# Patient Record
Sex: Female | Born: 1990 | Marital: Married | Smoking: Never smoker
Health system: Southern US, Community
[De-identification: ages and names within clinical notes are randomized; demographics above are authoritative.]

## PROBLEM LIST (undated history)

## (undated) DIAGNOSIS — Z789 Other specified health status: Secondary | ICD-10-CM

## (undated) DIAGNOSIS — E538 Deficiency of other specified B group vitamins: Secondary | ICD-10-CM

## (undated) DIAGNOSIS — E559 Vitamin D deficiency, unspecified: Secondary | ICD-10-CM

## (undated) HISTORY — DX: Vitamin D deficiency, unspecified: E55.9

## (undated) HISTORY — DX: Deficiency of other specified B group vitamins: E53.8

---

## 2016-02-16 HISTORY — PX: CERVICAL CERCLAGE: SHX1329

## 2016-02-16 NOTE — L&D Delivery Note (Signed)
Delivery Note Pt became complete at 5:33p and pushed well. At 6:48 PM a viable female was delivered via Vaginal, Spontaneous Delivery (Presentation: OA ).  APGAR: 9, 9; weight 5 lb 9.1 oz (2526 g).   Placenta status: spont, intact .  Cord: 3 vessel Cord clamped and cut by FOB; hospital cord blood sample collected.  Anesthesia:  None Episiotomy: None Lacerations: 2nd degree;Perineal Suture Repair: 3.0 monocryl Est. Blood Loss (mL): 200  Mom to postpartum.  Baby to Couplet care / Skin to Skin.  Cam HaiSHAW, Beulah Capobianco CNM 06/25/2016, 9:05 PM

## 2016-02-20 LAB — OB RESULTS CONSOLE HIV ANTIBODY (ROUTINE TESTING): HIV: NONREACTIVE

## 2016-02-20 LAB — OB RESULTS CONSOLE HGB/HCT, BLOOD: Hemoglobin: 10.2 g/dL

## 2016-04-08 ENCOUNTER — Encounter: Payer: Self-pay | Admitting: *Deleted

## 2016-04-13 ENCOUNTER — Ambulatory Visit (INDEPENDENT_AMBULATORY_CARE_PROVIDER_SITE_OTHER): Payer: Medicaid Other | Admitting: Student

## 2016-04-13 ENCOUNTER — Encounter: Payer: Self-pay | Admitting: *Deleted

## 2016-04-13 ENCOUNTER — Encounter: Payer: Self-pay | Admitting: Student

## 2016-04-13 VITALS — BP 116/75 | HR 103 | Wt 130.8 lb

## 2016-04-13 DIAGNOSIS — O26879 Cervical shortening, unspecified trimester: Secondary | ICD-10-CM

## 2016-04-13 DIAGNOSIS — Z113 Encounter for screening for infections with a predominantly sexual mode of transmission: Secondary | ICD-10-CM | POA: Diagnosis not present

## 2016-04-13 DIAGNOSIS — Z789 Other specified health status: Secondary | ICD-10-CM | POA: Insufficient documentation

## 2016-04-13 DIAGNOSIS — O0990 Supervision of high risk pregnancy, unspecified, unspecified trimester: Secondary | ICD-10-CM

## 2016-04-13 DIAGNOSIS — O26873 Cervical shortening, third trimester: Secondary | ICD-10-CM | POA: Diagnosis not present

## 2016-04-13 LAB — POCT URINALYSIS DIP (DEVICE)
Bilirubin Urine: NEGATIVE
Glucose, UA: NEGATIVE mg/dL
Hgb urine dipstick: NEGATIVE
Ketones, ur: NEGATIVE mg/dL
Nitrite: NEGATIVE
Protein, ur: NEGATIVE mg/dL
Specific Gravity, Urine: 1.01 (ref 1.005–1.030)
Urobilinogen, UA: 0.2 mg/dL (ref 0.0–1.0)
pH: 7 (ref 5.0–8.0)

## 2016-04-13 NOTE — Progress Notes (Signed)
Used [employer] interpreter [phone].  States had brownish discharge several times before she got a  cerclage while in [place]- none since 1/216 /17.

## 2016-04-13 NOTE — Progress Notes (Signed)
  Subjective:    Danielle Monroe is a G1P0 3560w2d being seen today for her first obstetrical visit.  Her obstetrical history is significant for ?cervical shortening with cerclage placement. . Patient does intend to breast feed. Pregnancy history fully reviewed. Patient states she had cerclage placed 1/16 due to her impending travel to the [place] from [place]. States she did not have preterm labor or cervical shortening. States she received weekly injections for 1 month but does not know what the injections were. States they were given to her b/c of brown discharge she was having.   Patient reports no complaints.  Vitals:   04/13/16 1302  BP: 116/75  Pulse: (!) 103  Weight: 130 lb 12.8 oz (59.3 kg)    HISTORY: OB History  Gravida Para Term Preterm AB Living  1            SAB TAB Ectopic Multiple Live Births               # Outcome Date GA Lbr Len/2nd Weight Sex Delivery Anes PTL Lv  1 Current              History reviewed. No pertinent past medical history. Past Surgical History:  Procedure Laterality Date  . CERVICAL CERCLAGE  02/2016   No family history on file.   Exam    Uterus:  Fundal Height: 28 cm  Pelvic Exam: Deferred d/t cerclage   Skin: normal coloration and turgor, no rashes    Neurologic: oriented, normal mood   Extremities: normal strength, tone, and muscle mass   HEENT PERRLA, thyroid without masses and trachea midline   Mouth/Teeth mucous membranes moist, pharynx normal without lesions and dental hygiene good   Neck supple and no masses   Cardiovascular: regular rate and rhythm, no murmurs or gallops   Respiratory:  appears well, vitals normal, no respiratory distress, acyanotic, normal RR, chest clear, no wheezing, crepitations, rhonchi, normal symmetric air entry   Abdomen: soft, non-tender; bowel sounds normal; no masses,  no organomegaly      Assessment:    Pregnancy: G1P0 Patient Active Problem List   Diagnosis Date Noted  . Short cervix  affecting pregnancy 04/13/2016  . Supervision of high-risk pregnancy 04/13/2016  . Language barrier 04/13/2016        Plan:  1. Short cervix affecting pregnancy -Prenatal record from [place] scanned to media. Per ultraound on 1/16, cervical length 3.8 cm & cervix closed. Per MD note, pt had cerclage placed d/t cervical shortening & funneling. Pt states that she did not have cervical shortening & only had cerclage placed to decrease chances of PTL during travel to the [place]. Type of cerclage not indicated in prenatal record. Will have pt see MD next visit to evaluate.  - Culture, OB Urine - Prenatal Profile I - GC/Chlamydia probe amp ([place])not at [facility]  2. Supervision of high risk pregnancy, antepartum -Pt is not fasting today. Will have her return later this week for 2hr gtt - Culture, OB Urine - Prenatal Profile I - GC/Chlamydia probe amp ([place])not at [facility]  3. Language barrier -Gujarati -- pt signed paperwork for aunt to interpret     Initial labs drawn. Prenatal vitamins. Problem list reviewed and updated.  Danielle Monroe 04/13/2016

## 2016-04-13 NOTE — Patient Instructions (Signed)
. °Preterm Labor and Birth Information °The normal length of a pregnancy is 39-41 weeks. Preterm labor is when labor starts before 37 completed weeks of pregnancy. °What are the risk factors for preterm labor? °Preterm labor is more likely to occur in women who: °· Have certain infections during pregnancy such as a bladder infection, sexually transmitted infection, or infection inside the uterus (chorioamnionitis). °· Have a shorter-than-normal cervix. °· Have gone into preterm labor before. °· Have had surgery on their cervix. °· Are younger than age 17 or older than age 35. °· Are African American. °· Are pregnant with twins or multiple babies (multiple gestation). °· Take street drugs or smoke while pregnant. °· Do not gain enough weight while pregnant. °· Became pregnant shortly after having been pregnant. °What are the symptoms of preterm labor? °Symptoms of preterm labor include: °· Cramps similar to those that can happen during a menstrual period. The cramps may happen with diarrhea. °· Pain in the abdomen or lower back. °· Regular uterine contractions that may feel like tightening of the abdomen. °· A feeling of increased pressure in the pelvis. °· Increased watery or bloody mucus discharge from the vagina. °· Water breaking (ruptured amniotic sac). °Why is it important to recognize signs of preterm labor? °It is important to recognize signs of preterm labor because babies who are born prematurely may not be fully developed. This can put them at an increased risk for: °· Long-term (chronic) heart and lung problems. °· Difficulty immediately after birth with regulating body systems, including blood sugar, body temperature, heart rate, and breathing rate. °· Bleeding in the brain. °· Cerebral palsy. °· Learning difficulties. °· Death. °These risks are highest for babies who are born before 34 weeks of pregnancy. °How is preterm labor treated? °Treatment depends on the length of your pregnancy, your condition,  and the health of your baby. It may involve: °· Having a stitch (suture) placed in your cervix to prevent your cervix from opening too early (cerclage). °· Taking or being given medicines, such as: °¨ Hormone medicines. These may be given early in pregnancy to help support the pregnancy. °¨ Medicine to stop contractions. °¨ Medicines to help mature the baby’s lungs. These may be prescribed if the risk of delivery is high. °¨ Medicines to prevent your baby from developing cerebral palsy. °If the labor happens before 34 weeks of pregnancy, you may need to stay in the hospital. °What should I do if I think I am in preterm labor? °If you think that you are going into preterm labor, call your health care provider right away. °How can I prevent preterm labor in future pregnancies? °To increase your chance of having a full-term pregnancy: °· Do not use any tobacco products, such as cigarettes, chewing tobacco, and e-cigarettes. If you need help quitting, ask your health care provider. °· Do not use street drugs or medicines that have not been prescribed to you during your pregnancy. °· Talk with your health care provider before taking any herbal supplements, even if you have been taking them regularly. °· Make sure you gain a healthy amount of weight during your pregnancy. °· Watch for infection. If you think that you might have an infection, get it checked right away. °· Make sure to tell your health care provider if you have gone into preterm labor before. °This information is not intended to replace advice given to you by your health care provider. Make sure you discuss any questions you have with your   health care provider. °Document Released: 04/24/2003 Document Revised: 07/15/2015 Document Reviewed: 06/25/2015 °Elsevier Interactive Patient Education © 2017 Elsevier Inc. ° °

## 2016-04-14 LAB — PRENATAL PROFILE I(LABCORP)
Antibody Screen: NEGATIVE
Basophils Absolute: 0 10*3/uL (ref 0.0–0.2)
Basos: 0 %
EOS (ABSOLUTE): 0 10*3/uL (ref 0.0–0.4)
Eos: 1 %
Hematocrit: 32.3 % — ABNORMAL LOW (ref 34.0–46.6)
Hemoglobin: 10.7 g/dL — ABNORMAL LOW (ref 11.1–15.9)
Hepatitis B Surface Ag: NEGATIVE
Immature Grans (Abs): 0.1 10*3/uL (ref 0.0–0.1)
Immature Granulocytes: 1 %
Lymphocytes Absolute: 1.8 10*3/uL (ref 0.7–3.1)
Lymphs: 22 %
MCH: 27.7 pg (ref 26.6–33.0)
MCHC: 33.1 g/dL (ref 31.5–35.7)
MCV: 84 fL (ref 79–97)
Monocytes Absolute: 0.7 10*3/uL (ref 0.1–0.9)
Monocytes: 8 %
Neutrophils Absolute: 6 10*3/uL (ref 1.4–7.0)
Neutrophils: 68 %
Platelets: 285 10*3/uL (ref 150–379)
RBC: 3.86 x10E6/uL (ref 3.77–5.28)
RDW: 14.3 % (ref 12.3–15.4)
RPR Ser Ql: NONREACTIVE
Rh Factor: POSITIVE
Rubella Antibodies, IGG: 2.59 index (ref >0.99)
WBC: 8.5 10*3/uL (ref 3.4–10.8)

## 2016-04-14 LAB — GC/CHLAMYDIA PROBE AMP (~~LOC~~) NOT AT ARMC
Chlamydia: NEGATIVE
Neisseria Gonorrhea: NEGATIVE

## 2016-04-15 LAB — CULTURE, OB URINE

## 2016-04-15 LAB — URINE CULTURE, OB REFLEX: Organism ID, Bacteria: NO GROWTH

## 2016-04-16 ENCOUNTER — Other Ambulatory Visit: Payer: Self-pay

## 2016-04-16 DIAGNOSIS — O0993 Supervision of high risk pregnancy, unspecified, third trimester: Secondary | ICD-10-CM

## 2016-04-17 LAB — GLUCOSE TOLERANCE, 2 HOURS W/ 1HR
Glucose, 1 hour: 146 mg/dL (ref 65–179)
Glucose, 2 hour: 113 mg/dL (ref 65–152)
Glucose, Fasting: 85 mg/dL (ref 65–91)

## 2016-04-28 ENCOUNTER — Ambulatory Visit (INDEPENDENT_AMBULATORY_CARE_PROVIDER_SITE_OTHER): Payer: Medicaid Other | Admitting: Family Medicine

## 2016-04-28 VITALS — BP 125/78 | HR 108 | Ht 60.0 in | Wt 135.0 lb

## 2016-04-28 DIAGNOSIS — Z23 Encounter for immunization: Secondary | ICD-10-CM

## 2016-04-28 DIAGNOSIS — O26879 Cervical shortening, unspecified trimester: Secondary | ICD-10-CM

## 2016-04-28 DIAGNOSIS — O0993 Supervision of high risk pregnancy, unspecified, third trimester: Secondary | ICD-10-CM

## 2016-04-28 NOTE — Progress Notes (Signed)
   PRENATAL VISIT NOTE  Subjective:  Danielle Monroe is a 26 y.o. G1P0 at 3147w3d being seen today for ongoing prenatal care.  She is currently monitored for the following issues for this low-risk pregnancy and has Short cervix affecting pregnancy; Supervision of high-risk pregnancy; and Language barrier on her problem list.  Patient reports no complaints.  Contractions: Not present. Vag. Bleeding: None.  Movement: Present. Denies leaking of fluid.   The following portions of the patient's history were reviewed and updated as appropriate: allergies, current medications, past family history, past medical history, past social history, past surgical history and problem list. Problem list updated.  Objective:   Vitals:   04/28/16 1437 04/28/16 1438  BP: 125/78   Pulse: (!) 108   Weight: 135 lb (61.2 kg)   Height:  5' (1.524 m)    Fetal Status: Fetal Heart Rate (bpm): 141 Fundal Height: 30 cm Movement: Present     General:  Alert, oriented and cooperative. Patient is in no acute distress.  Skin: Skin is warm and dry. No rash noted.   Cardiovascular: Normal heart rate noted  Respiratory: Normal respiratory effort, no problems with respiration noted  Abdomen: Soft, gravid, appropriate for gestational age. Pain/Pressure: Present     Pelvic:  Cervical exam deferred        Extremities: Normal range of motion.  Edema: None  Mental Status: Normal mood and affect. Normal behavior. Normal judgment and thought content.   Assessment and Plan:  Pregnancy: G1P0 at 9847w3d  1. Short cervix affecting pregnancy Has cerclage, no symptoms  2. Supervision of high risk pregnancy in third trimester TDaP today and needs anatomy u/s - Tdap vaccine greater than or equal to 7yo IM - US MFM OB COMP + 14 WK; Future  Preterm labor symptoms and general obstetric precautions including but not limited to vaginal bleeding, contractions, leaking of fluid and fetal movement were reviewed in detail with  the patient. Please refer to After Visit Summary for other counseling recommendations.  Return in 2 weeks (on 05/12/2016).   Reva Boresanya S Ingri Diemer, MD

## 2016-04-28 NOTE — Patient Instructions (Addendum)
Having a circumcision done in the hospital costs approximately $480.  This will have to be paid in full prior to circumcision being performed.  There are places to have circumcision done as an outpatient which are cheaper.    Circumcisions      Provider   Phone    Price     ------------------------------------------------------------------------------   [facility]  [phone]  $480 by 4 wks     [facility]   [phone]  $[phone] by 4 wks     [facility]   [phone]  $[phone] by 2 wks    [facility]   [phone]  $250 by 7 days [facility]   [phone]  $150 by 4 wks    Third Trimester of Pregnancy The third trimester is from week 28 through week 40 (months 7 through 9). The third trimester is a time when the unborn baby (fetus) is growing rapidly. At the end of the ninth month, the fetus is about 20 inches in length and weighs 6-10 pounds. Body changes during your third trimester Your body will continue to go through many changes during pregnancy. The changes vary from woman to woman. During the third trimester:  Your weight will continue to increase. You can expect to gain 25-35 pounds (11-16 kg) by the end of the pregnancy.  You may begin to get stretch marks on your hips, abdomen, and breasts.  You may urinate more often because the fetus is moving lower into your pelvis and pressing on your bladder.  You may develop or continue to have heartburn. This is caused by increased hormones that slow down muscles in the digestive tract.  You may develop or continue to have constipation because increased hormones slow digestion and cause the muscles that push waste through your intestines to relax.  You may develop hemorrhoids. These are swollen veins (varicose veins) in the rectum that can itch or be painful.  You may develop swollen, bulging veins (varicose veins) in your legs.  You may have increased body aches in the pelvis, back, or thighs. This is due to weight gain and increased hormones that are relaxing  your joints.  You may have changes in your hair. These can include thickening of your hair, rapid growth, and changes in texture. Some women also have hair loss during or after pregnancy, or hair that feels dry or thin. Your hair will most likely return to normal after your baby is born.  Your breasts will continue to grow and they will continue to become tender. A yellow fluid (colostrum) may leak from your breasts. This is the first milk you are producing for your baby.  Your belly button may stick out.  You may notice more swelling in your hands, face, or ankles.  You may have increased tingling or numbness in your hands, arms, and legs. The skin on your belly may also feel numb.  You may feel short of breath because of your expanding uterus.  You may have more problems sleeping. This can be caused by the size of your belly, increased need to urinate, and an increase in your body's metabolism.  You may notice the fetus "dropping," or moving lower in your abdomen (lightening).  You may have increased vaginal discharge.  You may notice your joints feel loose and you may have pain around your pelvic bone. What to expect at prenatal visits You will have prenatal exams every 2 weeks until week 36. Then you will have weekly prenatal exams. During a routine prenatal visit:  You will be weighed to make sure you and the baby are growing normally.  Your blood pressure will be taken.  Your abdomen will be measured to track your baby's growth.  The fetal heartbeat will be listened to.  Any test results from the previous visit will be discussed.  You may have a cervical check near your due date to see if your cervix has softened or thinned (effaced).  You will be tested for Group B streptococcus. This happens between 35 and 37 weeks. Your health care provider may ask you:  What your birth plan is.  How you are feeling.  If you are feeling the baby move.  If you have had any  abnormal symptoms, such as leaking fluid, bleeding, severe headaches, or abdominal cramping.  If you are using any tobacco products, including cigarettes, chewing tobacco, and electronic cigarettes.  If you have any questions. Other tests or screenings that may be performed during your third trimester include:  Blood tests that check for low iron levels (anemia).  Fetal testing to check the health, activity level, and growth of the fetus. Testing is done if you have certain medical conditions or if there are problems during the pregnancy.  Nonstress test (NST). This test checks the health of your baby to make sure there are no signs of problems, such as the baby not getting enough oxygen. During this test, a belt is placed around your belly. The baby is made to move, and its heart rate is monitored during movement. What is false labor? False labor is a condition in which you feel small, irregular tightenings of the muscles in the womb (contractions) that usually go away with rest, changing position, or drinking water. These are called Braxton Hicks contractions. Contractions may last for hours, days, or even weeks before true labor sets in. If contractions come at regular intervals, become more frequent, increase in intensity, or become painful, you should see your health care provider. What are the signs of labor?  Abdominal cramps.  Regular contractions that start at 10 minutes apart and become stronger and more frequent with time.  Contractions that start on the top of the uterus and spread down to the lower abdomen and back.  Increased pelvic pressure and dull back pain.  A watery or bloody mucus discharge that comes from the vagina.  Leaking of amniotic fluid. This is also known as your "water breaking." It could be a slow trickle or a gush. Let your health care provider know if it has a color or strange odor. If you have any of these signs, call your health care provider right away,  even if it is before your due date. Follow these instructions at home: Medicines   Follow your health care provider's instructions regarding medicine use. Specific medicines may be either safe or unsafe to take during pregnancy.  Take a prenatal vitamin that contains at least 600 micrograms (mcg) of folic acid.  If you develop constipation, try taking a stool softener if your health care provider approves. Eating and drinking   Eat a balanced diet that includes fresh fruits and vegetables, whole grains, good sources of protein such as meat, eggs, or tofu, and low-fat dairy. Your health care provider will help you determine the amount of weight gain that is right for you.  Avoid raw meat and uncooked cheese. These carry germs that can cause birth defects in the baby.  If you have low calcium intake from food, talk to your health  care provider about whether you should take a daily calcium supplement.  Eat four or five small meals rather than three large meals a day.  Limit foods that are high in fat and processed sugars, such as fried and sweet foods.  To prevent constipation:  Drink enough fluid to keep your urine clear or pale yellow.  Eat foods that are high in fiber, such as fresh fruits and vegetables, whole grains, and beans. Activity   Exercise only as directed by your health care provider. Most women can continue their usual exercise routine during pregnancy. Try to exercise for 30 minutes at least 5 days a week. Stop exercising if you experience uterine contractions.  Avoid heavy lifting.  Do not exercise in extreme heat or humidity, or at high altitudes.  Wear low-heel, comfortable shoes.  Practice good posture.  You may continue to have sex unless your health care provider tells you otherwise. Relieving pain and discomfort   Take frequent breaks and rest with your legs elevated if you have leg cramps or low back pain.  Take warm sitz baths to soothe any pain or  discomfort caused by hemorrhoids. Use hemorrhoid cream if your health care provider approves.  Wear a good support bra to prevent discomfort from breast tenderness.  If you develop varicose veins:  Wear support pantyhose or compression stockings as told by your healthcare provider.  Elevate your feet for 15 minutes, 3-4 times a day. Prenatal care   Write down your questions. Take them to your prenatal visits.  Keep all your prenatal visits as told by your health care provider. This is important. Safety   Wear your seat belt at all times when driving.  Make a list of emergency phone numbers, including numbers for family, friends, the hospital, and police and fire departments. General instructions   Avoid cat litter boxes and soil used by cats. These carry germs that can cause birth defects in the baby. If you have a cat, ask someone to clean the litter box for you.  Do not travel far distances unless it is absolutely necessary and only with the approval of your health care provider.  Do not use hot tubs, steam rooms, or saunas.  Do not drink alcohol.  Do not use any products that contain nicotine or tobacco, such as cigarettes and e-cigarettes. If you need help quitting, ask your health care provider.  Do not use any medicinal herbs or unprescribed drugs. These chemicals affect the formation and growth of the baby.  Do not douche or use tampons or scented sanitary pads.  Do not cross your legs for long periods of time.  To prepare for the arrival of your baby:  Take prenatal classes to understand, practice, and ask questions about labor and delivery.  Make a trial run to the hospital.  Visit the hospital and tour the maternity area.  Arrange for maternity or paternity leave through employers.  Arrange for family and friends to take care of pets while you are in the hospital.  Purchase a rear-facing car seat and make sure you know how to install it in your car.  Pack  your hospital bag.  Prepare the baby's nursery. Make sure to remove all pillows and stuffed animals from the baby's crib to prevent suffocation.  Visit your dentist if you have not gone during your pregnancy. Use a soft toothbrush to brush your teeth and be gentle when you floss. Contact a health care provider if:  You are unsure if  you are in labor or if your water has broken.  You become dizzy.  You have mild pelvic cramps, pelvic pressure, or nagging pain in your abdominal area.  You have lower back pain.  You have persistent nausea, vomiting, or diarrhea.  You have an unusual or bad smelling vaginal discharge.  You have pain when you urinate. Get help right away if:  Your water breaks before 37 weeks.  You have regular contractions less than 5 minutes apart before 37 weeks.  You have a fever.  You are leaking fluid from your vagina.  You have spotting or bleeding from your vagina.  You have severe abdominal pain or cramping.  You have rapid weight loss or weight gain.  You have shortness of breath with chest pain.  You notice sudden or extreme swelling of your face, hands, ankles, feet, or legs.  Your baby makes fewer than 10 movements in 2 hours.  You have severe headaches that do not go away when you take medicine.  You have vision changes. Summary  The third trimester is from week 28 through week 40, months 7 through 9. The third trimester is a time when the unborn baby (fetus) is growing rapidly.  During the third trimester, your discomfort may increase as you and your baby continue to gain weight. You may have abdominal, leg, and back pain, sleeping problems, and an increased need to urinate.  During the third trimester your breasts will keep growing and they will continue to become tender. A yellow fluid (colostrum) may leak from your breasts. This is the first milk you are producing for your baby.  False labor is a condition in which you feel small,  irregular tightenings of the muscles in the womb (contractions) that eventually go away. These are called Braxton Hicks contractions. Contractions may last for hours, days, or even weeks before true labor sets in.  Signs of labor can include: abdominal cramps; regular contractions that start at 10 minutes apart and become stronger and more frequent with time; watery or bloody mucus discharge that comes from the vagina; increased pelvic pressure and dull back pain; and leaking of amniotic fluid. This information is not intended to replace advice given to you by your health care provider. Make sure you discuss any questions you have with your health care provider. Document Released: 01/26/2001 Document Revised: 07/10/2015 Document Reviewed: 04/04/2012 Elsevier Interactive Patient Education  2017 [employer]

## 2016-04-28 NOTE — Progress Notes (Signed)
Release of Interpretation Form Completed  Anatomy US scheduled for March 23rd @ 1030.  Pt notified.

## 2016-05-07 ENCOUNTER — Other Ambulatory Visit: Payer: Self-pay | Admitting: Family Medicine

## 2016-05-07 ENCOUNTER — Other Ambulatory Visit (HOSPITAL_COMMUNITY): Payer: Self-pay | Admitting: *Deleted

## 2016-05-07 ENCOUNTER — Encounter (HOSPITAL_COMMUNITY): Payer: Self-pay

## 2016-05-07 ENCOUNTER — Ambulatory Visit (HOSPITAL_COMMUNITY)
Admission: RE | Admit: 2016-05-07 | Discharge: 2016-05-07 | Disposition: A | Discharge status: Home / Self Care | Payer: Medicaid Other | Source: Ambulatory Visit | Attending: Family Medicine | Admitting: Family Medicine

## 2016-05-07 DIAGNOSIS — Z3689 Encounter for other specified antenatal screening: Secondary | ICD-10-CM

## 2016-05-07 DIAGNOSIS — O358XX0 Maternal care for other (suspected) fetal abnormality and damage, not applicable or unspecified: Secondary | ICD-10-CM | POA: Diagnosis not present

## 2016-05-07 DIAGNOSIS — O0993 Supervision of high risk pregnancy, unspecified, third trimester: Secondary | ICD-10-CM

## 2016-05-07 DIAGNOSIS — Z3A30 30 weeks gestation of pregnancy: Secondary | ICD-10-CM

## 2016-05-07 HISTORY — DX: Other specified health status: Z78.9

## 2016-05-11 ENCOUNTER — Ambulatory Visit (HOSPITAL_COMMUNITY)
Admission: RE | Admit: 2016-05-11 | Discharge: 2016-05-11 | Disposition: A | Discharge status: Home / Self Care | Payer: Medicaid Other | Source: Ambulatory Visit | Attending: Family Medicine | Admitting: Family Medicine

## 2016-05-11 ENCOUNTER — Encounter (HOSPITAL_COMMUNITY): Payer: Self-pay

## 2016-05-11 ENCOUNTER — Ambulatory Visit (INDEPENDENT_AMBULATORY_CARE_PROVIDER_SITE_OTHER): Payer: Medicaid Other | Admitting: Family Medicine

## 2016-05-11 ENCOUNTER — Other Ambulatory Visit (HOSPITAL_COMMUNITY): Payer: Self-pay | Admitting: Maternal and Fetal Medicine

## 2016-05-11 VITALS — BP 120/75 | HR 103 | Wt 135.0 lb

## 2016-05-11 DIAGNOSIS — O358XX0 Maternal care for other (suspected) fetal abnormality and damage, not applicable or unspecified: Secondary | ICD-10-CM | POA: Insufficient documentation

## 2016-05-11 DIAGNOSIS — O0993 Supervision of high risk pregnancy, unspecified, third trimester: Secondary | ICD-10-CM

## 2016-05-11 DIAGNOSIS — Z23 Encounter for immunization: Secondary | ICD-10-CM

## 2016-05-11 DIAGNOSIS — Z3A30 30 weeks gestation of pregnancy: Secondary | ICD-10-CM | POA: Insufficient documentation

## 2016-05-11 DIAGNOSIS — O26879 Cervical shortening, unspecified trimester: Secondary | ICD-10-CM

## 2016-05-11 DIAGNOSIS — O26873 Cervical shortening, third trimester: Secondary | ICD-10-CM

## 2016-05-11 DIAGNOSIS — Z789 Other specified health status: Secondary | ICD-10-CM

## 2016-05-11 NOTE — Progress Notes (Signed)
   PRENATAL VISIT NOTE  Subjective:  Danielle Monroe is a 26 y.o. G1P0 at 3057w4d being seen today for ongoing prenatal care.  She is currently monitored for the following issues for this high-risk pregnancy and has Short cervix affecting pregnancy; Supervision of high-risk pregnancy; Language barrier; and Umbilical vein varix affecting pregnancy on her problem list.  Patient reports no complaints and good fetal movement.  Contractions: Not present.  .  Movement: Present. Denies leaking of fluid.   The following portions of the patient's history were reviewed and updated as appropriate: allergies, current medications, past family history, past medical history, past social history, past surgical history and problem list. Problem list updated.  Objective:   Vitals:   05/11/16 0813  BP: 120/75  Pulse: (!) 103  Weight: 135 lb (61.2 kg)    Fetal Status: Fetal Heart Rate (bpm): 135 Fundal Height: 30 cm Movement: Present     General:  Alert, oriented and cooperative. Patient is in no acute distress.  Skin: Skin is warm and dry. No rash noted.   Cardiovascular: Normal heart rate noted  Respiratory: Normal respiratory effort, no problems with respiration noted  Abdomen: Soft, gravid, appropriate for gestational age. Pain/Pressure: Present     Pelvic:  Cervical exam deferred        Extremities: Normal range of motion.     Mental Status: Normal mood and affect. Normal behavior. Normal judgment and thought content.   Assessment and Plan:  Pregnancy: G1P0 at 6857w4d  1. Supervision of high risk pregnancy in third trimester Continue prenatal care - Flu Vaccine QUAD with presevative  2. Short cervix affecting pregnancy Has cerclage in place--plan removal 36-37 wks  3. Umbilical vein abnormality affecting pregnancy, single or unspecified fetus Per MFM scan--has weekly assessments with [facility] scheduled.  Preterm labor symptoms and general obstetric precautions including but not limited  to vaginal bleeding, contractions, leaking of fluid and fetal movement were reviewed in detail with the patient. Please refer to After Visit Summary for other counseling recommendations.  Return in 2 weeks (on 05/25/2016).   Reva Boresanya S Zameria Vogl, MD

## 2016-05-11 NOTE — Patient Instructions (Signed)
 Third Trimester of Pregnancy The third trimester is from week 28 through week 40 (months 7 through 9). The third trimester is a time when the unborn baby (fetus) is growing rapidly. At the end of the ninth month, the fetus is about 20 inches in length and weighs 6-10 pounds. Body changes during your third trimester Your body will continue to go through many changes during pregnancy. The changes vary from woman to woman. During the third trimester:  Your weight will continue to increase. You can expect to gain 25-35 pounds (11-16 kg) by the end of the pregnancy.  You may begin to get stretch marks on your hips, abdomen, and breasts.  You may urinate more often because the fetus is moving lower into your pelvis and pressing on your bladder.  You may develop or continue to have heartburn. This is caused by increased hormones that slow down muscles in the digestive tract.  You may develop or continue to have constipation because increased hormones slow digestion and cause the muscles that push waste through your intestines to relax.  You may develop hemorrhoids. These are swollen veins (varicose veins) in the rectum that can itch or be painful.  You may develop swollen, bulging veins (varicose veins) in your legs.  You may have increased body aches in the pelvis, back, or thighs. This is due to weight gain and increased hormones that are relaxing your joints.  You may have changes in your hair. These can include thickening of your hair, rapid growth, and changes in texture. Some women also have hair loss during or after pregnancy, or hair that feels dry or thin. Your hair will most likely return to normal after your baby is born.  Your breasts will continue to grow and they will continue to become tender. A yellow fluid (colostrum) may leak from your breasts. This is the first milk you are producing for your baby.  Your belly button may stick out.  You may notice more swelling in your  hands, face, or ankles.  You may have increased tingling or numbness in your hands, arms, and legs. The skin on your belly may also feel numb.  You may feel short of breath because of your expanding uterus.  You may have more problems sleeping. This can be caused by the size of your belly, increased need to urinate, and an increase in your body's metabolism.  You may notice the fetus "dropping," or moving lower in your abdomen (lightening).  You may have increased vaginal discharge.  You may notice your joints feel loose and you may have pain around your pelvic bone.  What to expect at prenatal visits You will have prenatal exams every 2 weeks until week 36. Then you will have weekly prenatal exams. During a routine prenatal visit:  You will be weighed to make sure you and the baby are growing normally.  Your blood pressure will be taken.  Your abdomen will be measured to track your baby's growth.  The fetal heartbeat will be listened to.  Any test results from the previous visit will be discussed.  You may have a cervical check near your due date to see if your cervix has softened or thinned (effaced).  You will be tested for Group B streptococcus. This happens between 35 and 37 weeks.  Your health care provider may ask you:  What your birth plan is.  How you are feeling.  If you are feeling the baby move.  If you have   had any abnormal symptoms, such as leaking fluid, bleeding, severe headaches, or abdominal cramping.  If you are using any tobacco products, including cigarettes, chewing tobacco, and electronic cigarettes.  If you have any questions.  Other tests or screenings that may be performed during your third trimester include:  Blood tests that check for low iron levels (anemia).  Fetal testing to check the health, activity level, and growth of the fetus. Testing is done if you have certain medical conditions or if there are problems during the  pregnancy.  Nonstress test (NST). This test checks the health of your baby to make sure there are no signs of problems, such as the baby not getting enough oxygen. During this test, a belt is placed around your belly. The baby is made to move, and its heart rate is monitored during movement.  What is false labor? False labor is a condition in which you feel small, irregular tightenings of the muscles in the womb (contractions) that usually go away with rest, changing position, or drinking water. These are called Braxton Hicks contractions. Contractions may last for hours, days, or even weeks before true labor sets in. If contractions come at regular intervals, become more frequent, increase in intensity, or become painful, you should see your health care provider. What are the signs of labor?  Abdominal cramps.  Regular contractions that start at 10 minutes apart and become stronger and more frequent with time.  Contractions that start on the top of the uterus and spread down to the lower abdomen and back.  Increased pelvic pressure and dull back pain.  A watery or bloody mucus discharge that comes from the vagina.  Leaking of amniotic fluid. This is also known as your "water breaking." It could be a slow trickle or a gush. Let your health care provider know if it has a color or strange odor. If you have any of these signs, call your health care provider right away, even if it is before your due date. Follow these instructions at home: Medicines  Follow your health care provider's instructions regarding medicine use. Specific medicines may be either safe or unsafe to take during pregnancy.  Take a prenatal vitamin that contains at least 600 micrograms (mcg) of folic acid.  If you develop constipation, try taking a stool softener if your health care provider approves. Eating and drinking  Eat a balanced diet that includes fresh fruits and vegetables, whole grains, good sources of protein  such as meat, eggs, or tofu, and low-fat dairy. Your health care provider will help you determine the amount of weight gain that is right for you.  Avoid raw meat and uncooked cheese. These carry germs that can cause birth defects in the baby.  If you have low calcium intake from food, talk to your health care provider about whether you should take a daily calcium supplement.  Eat four or five small meals rather than three large meals a day.  Limit foods that are high in fat and processed sugars, such as fried and sweet foods.  To prevent constipation: ? Drink enough fluid to keep your urine clear or pale yellow. ? Eat foods that are high in fiber, such as fresh fruits and vegetables, whole grains, and beans. Activity  Exercise only as directed by your health care provider. Most women can continue their usual exercise routine during pregnancy. Try to exercise for 30 minutes at least 5 days a week. Stop exercising if you experience uterine contractions.  Avoid   heavy lifting.  Do not exercise in extreme heat or humidity, or at high altitudes.  Wear low-heel, comfortable shoes.  Practice good posture.  You may continue to have sex unless your health care provider tells you otherwise. Relieving pain and discomfort  Take frequent breaks and rest with your legs elevated if you have leg cramps or low back pain.  Take warm sitz baths to soothe any pain or discomfort caused by hemorrhoids. Use hemorrhoid cream if your health care provider approves.  Wear a good support bra to prevent discomfort from breast tenderness.  If you develop varicose veins: ? Wear support pantyhose or compression stockings as told by your healthcare provider. ? Elevate your feet for 15 minutes, 3-4 times a day. Prenatal care  Write down your questions. Take them to your prenatal visits.  Keep all your prenatal visits as told by your health care provider. This is important. Safety  Wear your seat belt at  all times when driving.  Make a list of emergency phone numbers, including numbers for family, friends, the hospital, and police and fire departments. General instructions  Avoid cat litter boxes and soil used by cats. These carry germs that can cause birth defects in the baby. If you have a cat, ask someone to clean the litter box for you.  Do not travel far distances unless it is absolutely necessary and only with the approval of your health care provider.  Do not use hot tubs, steam rooms, or saunas.  Do not drink alcohol.  Do not use any products that contain nicotine or tobacco, such as cigarettes and e-cigarettes. If you need help quitting, ask your health care provider.  Do not use any medicinal herbs or unprescribed drugs. These chemicals affect the formation and growth of the baby.  Do not douche or use tampons or scented sanitary pads.  Do not cross your legs for long periods of time.  To prepare for the arrival of your baby: ? Take prenatal classes to understand, practice, and ask questions about labor and delivery. ? Make a trial run to the hospital. ? Visit the hospital and tour the maternity area. ? Arrange for maternity or paternity leave through employers. ? Arrange for family and friends to take care of pets while you are in the hospital. ? Purchase a rear-facing car seat and make sure you know how to install it in your car. ? Pack your hospital bag. ? Prepare the baby's nursery. Make sure to remove all pillows and stuffed animals from the baby's crib to prevent suffocation.  Visit your dentist if you have not gone during your pregnancy. Use a soft toothbrush to brush your teeth and be gentle when you floss. Contact a health care provider if:  You are unsure if you are in labor or if your water has broken.  You become dizzy.  You have mild pelvic cramps, pelvic pressure, or nagging pain in your abdominal area.  You have lower back pain.  You have persistent  nausea, vomiting, or diarrhea.  You have an unusual or bad smelling vaginal discharge.  You have pain when you urinate. Get help right away if:  Your water breaks before 37 weeks.  You have regular contractions less than 5 minutes apart before 37 weeks.  You have a fever.  You are leaking fluid from your vagina.  You have spotting or bleeding from your vagina.  You have severe abdominal pain or cramping.  You have rapid weight loss or weight   gain.  You have shortness of breath with chest pain.  You notice sudden or extreme swelling of your face, hands, ankles, feet, or legs.  Your baby makes fewer than 10 movements in 2 hours.  You have severe headaches that do not go away when you take medicine.  You have vision changes. Summary  The third trimester is from week 28 through week 40, months 7 through 9. The third trimester is a time when the unborn baby (fetus) is growing rapidly.  During the third trimester, your discomfort may increase as you and your baby continue to gain weight. You may have abdominal, leg, and back pain, sleeping problems, and an increased need to urinate.  During the third trimester your breasts will keep growing and they will continue to become tender. A yellow fluid (colostrum) may leak from your breasts. This is the first milk you are producing for your baby.  False labor is a condition in which you feel small, irregular tightenings of the muscles in the womb (contractions) that eventually go away. These are called Braxton Hicks contractions. Contractions may last for hours, days, or even weeks before true labor sets in.  Signs of labor can include: abdominal cramps; regular contractions that start at 10 minutes apart and become stronger and more frequent with time; watery or bloody mucus discharge that comes from the vagina; increased pelvic pressure and dull back pain; and leaking of amniotic fluid. This information is not intended to replace advice  given to you by your health care provider. Make sure you discuss any questions you have with your health care provider. Document Released: 01/26/2001 Document Revised: 07/10/2015 Document Reviewed: 04/04/2012 [employer] Interactive Patient Education  2017 [employer]   Breastfeeding Deciding to breastfeed is one of the best choices you can make for you and your baby. A change in hormones during pregnancy causes your breast tissue to grow and increases the number and size of your milk ducts. These hormones also allow proteins, sugars, and fats from your blood supply to make breast milk in your milk-producing glands. Hormones prevent breast milk from being released before your baby is born as well as prompt milk flow after birth. Once breastfeeding has begun, thoughts of your baby, as well as his or her sucking or crying, can stimulate the release of milk from your milk-producing glands. Benefits of breastfeeding For Your Baby  Your first milk (colostrum) helps your baby's digestive system function better.  There are antibodies in your milk that help your baby fight off infections.  Your baby has a lower incidence of asthma, allergies, and sudden infant death syndrome.  The nutrients in breast milk are better for your baby than infant formulas and are designed uniquely for your baby's needs.  Breast milk improves your baby's brain development.  Your baby is less likely to develop other conditions, such as childhood obesity, asthma, or type 2 diabetes mellitus.  For You  Breastfeeding helps to create a very special bond between you and your baby.  Breastfeeding is convenient. Breast milk is always available at the correct temperature and costs nothing.  Breastfeeding helps to burn calories and helps you lose the weight gained during pregnancy.  Breastfeeding makes your uterus contract to its prepregnancy size faster and slows bleeding (lochia) after you give birth.  Breastfeeding helps  to lower your risk of developing type 2 diabetes mellitus, osteoporosis, and breast or ovarian cancer later in life.  Signs that your baby is hungry Early Signs of Hunger    Increased alertness or activity.  Stretching.  Movement of the head from side to side.  Movement of the head and opening of the mouth when the corner of the mouth or cheek is stroked (rooting).  Increased sucking sounds, smacking lips, cooing, sighing, or squeaking.  Hand-to-mouth movements.  Increased sucking of fingers or hands.  Late Signs of Hunger  Fussing.  Intermittent crying.  Extreme Signs of Hunger Signs of extreme hunger will require calming and consoling before your baby will be able to breastfeed successfully. Do not wait for the following signs of extreme hunger to occur before you initiate breastfeeding:  Restlessness.  A loud, strong cry.  Screaming.  Breastfeeding basics Breastfeeding Initiation  Find a comfortable place to sit or lie down, with your neck and back well supported.  Place a pillow or rolled up blanket under your baby to bring him or her to the level of your breast (if you are seated). Nursing pillows are specially designed to help support your arms and your baby while you breastfeed.  Make sure that your baby's abdomen is facing your abdomen.  Gently massage your breast. With your fingertips, massage from your chest wall toward your nipple in a circular motion. This encourages milk flow. You may need to continue this action during the feeding if your milk flows slowly.  Support your breast with 4 fingers underneath and your thumb above your nipple. Make sure your fingers are well away from your nipple and your baby's mouth.  Stroke your baby's lips gently with your finger or nipple.  When your baby's mouth is open wide enough, quickly bring your baby to your breast, placing your entire nipple and as much of the colored area around your nipple (areola) as possible into  your baby's mouth. ? More areola should be visible above your baby's upper lip than below the lower lip. ? Your baby's tongue should be between his or her lower gum and your breast.  Ensure that your baby's mouth is correctly positioned around your nipple (latched). Your baby's lips should create a seal on your breast and be turned out (everted).  It is common for your baby to suck about 2-3 minutes in order to start the flow of breast milk.  Latching Teaching your baby how to latch on to your breast properly is very important. An improper latch can cause nipple pain and decreased milk supply for you and poor weight gain in your baby. Also, if your baby is not latched onto your nipple properly, he or she may swallow some air during feeding. This can make your baby fussy. Burping your baby when you switch breasts during the feeding can help to get rid of the air. However, teaching your baby to latch on properly is still the best way to prevent fussiness from swallowing air while breastfeeding. Signs that your baby has successfully latched on to your nipple:  Silent tugging or silent sucking, without causing you pain.  Swallowing heard between every 3-4 sucks.  Muscle movement above and in front of his or her ears while sucking.  Signs that your baby has not successfully latched on to nipple:  Sucking sounds or smacking sounds from your baby while breastfeeding.  Nipple pain.  If you think your baby has not latched on correctly, slip your finger into the corner of your baby's mouth to break the suction and place it between your baby's gums. Attempt breastfeeding initiation again. Signs of Successful Breastfeeding Signs from your baby:  A   gradual decrease in the number of sucks or complete cessation of sucking.  Falling asleep.  Relaxation of his or her body.  Retention of a small amount of milk in his or her mouth.  Letting go of your breast by himself or herself.  Signs from  you:  Breasts that have increased in firmness, weight, and size 1-3 hours after feeding.  Breasts that are softer immediately after breastfeeding.  Increased milk volume, as well as a change in milk consistency and color by the fifth day of breastfeeding.  Nipples that are not sore, cracked, or bleeding.  Signs That Your Baby is Getting Enough Milk  Wetting at least 1-2 diapers during the first 24 hours after birth.  Wetting at least 5-6 diapers every 24 hours for the first week after birth. The urine should be clear or pale yellow by 5 days after birth.  Wetting 6-8 diapers every 24 hours as your baby continues to grow and develop.  At least 3 stools in a 24-hour period by age 5 days. The stool should be soft and yellow.  At least 3 stools in a 24-hour period by age 7 days. The stool should be seedy and yellow.  No loss of weight greater than 10% of birth weight during the first 3 days of age.  Average weight gain of 4-7 ounces (113-198 g) per week after age 4 days.  Consistent daily weight gain by age 5 days, without weight loss after the age of 2 weeks.  After a feeding, your baby may spit up a small amount. This is common. Breastfeeding frequency and duration Frequent feeding will help you make more milk and can prevent sore nipples and breast engorgement. Breastfeed when you feel the need to reduce the fullness of your breasts or when your baby shows signs of hunger. This is called "breastfeeding on demand." Avoid introducing a pacifier to your baby while you are working to establish breastfeeding (the first 4-6 weeks after your baby is born). After this time you may choose to use a pacifier. Research has shown that pacifier use during the first year of a baby's life decreases the risk of sudden infant death syndrome (SIDS). Allow your baby to feed on each breast as long as he or she wants. Breastfeed until your baby is finished feeding. When your baby unlatches or falls asleep  while feeding from the first breast, offer the second breast. Because newborns are often sleepy in the first few weeks of life, you may need to awaken your baby to get him or her to feed. Breastfeeding times will vary from baby to baby. However, the following rules can serve as a guide to help you ensure that your baby is properly fed:  Newborns (babies 4 weeks of age or younger) may breastfeed every 1-3 hours.  Newborns should not go longer than 3 hours during the day or 5 hours during the night without breastfeeding.  You should breastfeed your baby a minimum of 8 times in a 24-hour period until you begin to introduce solid foods to your baby at around 6 months of age.  Breast milk pumping Pumping and storing breast milk allows you to ensure that your baby is exclusively fed your breast milk, even at times when you are unable to breastfeed. This is especially important if you are going back to work while you are still breastfeeding or when you are not able to be present during feedings. Your lactation consultant can give you guidelines on how   long it is safe to store breast milk. A breast pump is a machine that allows you to pump milk from your breast into a sterile bottle. The pumped breast milk can then be stored in a refrigerator or freezer. Some breast pumps are operated by hand, while others use electricity. Ask your lactation consultant which type will work best for you. Breast pumps can be purchased, but some hospitals and breastfeeding support groups lease breast pumps on a monthly basis. A lactation consultant can teach you how to hand express breast milk, if you prefer not to use a pump. Caring for your breasts while you breastfeed Nipples can become dry, cracked, and sore while breastfeeding. The following recommendations can help keep your breasts moisturized and healthy:  Avoid using soap on your nipples.  Wear a supportive bra. Although not required, special nursing bras and tank  tops are designed to allow access to your breasts for breastfeeding without taking off your entire bra or top. Avoid wearing underwire-style bras or extremely tight bras.  Air dry your nipples for 3-4minutes after each feeding.  Use only cotton bra pads to absorb leaked breast milk. Leaking of breast milk between feedings is normal.  Use lanolin on your nipples after breastfeeding. Lanolin helps to maintain your skin's normal moisture barrier. If you use pure lanolin, you do not need to wash it off before feeding your baby again. Pure lanolin is not toxic to your baby. You may also hand express a few drops of breast milk and gently massage that milk into your nipples and allow the milk to air dry.  In the first few weeks after giving birth, some women experience extremely full breasts (engorgement). Engorgement can make your breasts feel heavy, warm, and tender to the touch. Engorgement peaks within 3-5 days after you give birth. The following recommendations can help ease engorgement:  Completely empty your breasts while breastfeeding or pumping. You may want to start by applying warm, moist heat (in the shower or with warm water-soaked hand towels) just before feeding or pumping. This increases circulation and helps the milk flow. If your baby does not completely empty your breasts while breastfeeding, pump any extra milk after he or she is finished.  Wear a snug bra (nursing or regular) or tank top for 1-2 days to signal your body to slightly decrease milk production.  Apply ice packs to your breasts, unless this is too uncomfortable for you.  Make sure that your baby is latched on and positioned properly while breastfeeding.  If engorgement persists after 48 hours of following these recommendations, contact your health care provider or a lactation consultant. Overall health care recommendations while breastfeeding  Eat healthy foods. Alternate between meals and snacks, eating 3 of each per  day. Because what you eat affects your breast milk, some of the foods may make your baby more irritable than usual. Avoid eating these foods if you are sure that they are negatively affecting your baby.  Drink milk, fruit juice, and water to satisfy your thirst (about 10 glasses a day).  Rest often, relax, and continue to take your prenatal vitamins to prevent fatigue, stress, and anemia.  Continue breast self-awareness checks.  Avoid chewing and smoking tobacco. Chemicals from cigarettes that pass into breast milk and exposure to secondhand smoke may harm your baby.  Avoid alcohol and drug use, including marijuana. Some medicines that may be harmful to your baby can pass through breast milk. It is important to ask your health care   provider before taking any medicine, including all over-the-counter and prescription medicine as well as vitamin and herbal supplements. It is possible to become pregnant while breastfeeding. If birth control is desired, ask your health care provider about options that will be safe for your baby. Contact a health care provider if:  You feel like you want to stop breastfeeding or have become frustrated with breastfeeding.  You have painful breasts or nipples.  Your nipples are cracked or bleeding.  Your breasts are red, tender, or warm.  You have a swollen area on either breast.  You have a fever or chills.  You have nausea or vomiting.  You have drainage other than breast milk from your nipples.  Your breasts do not become full before feedings by the fifth day after you give birth.  You feel sad and depressed.  Your baby is too sleepy to eat well.  Your baby is having trouble sleeping.  Your baby is wetting less than 3 diapers in a 24-hour period.  Your baby has less than 3 stools in a 24-hour period.  Your baby's skin or the white part of his or her eyes becomes yellow.  Your baby is not gaining weight by 5 days of age. Get help right away  if:  Your baby is overly tired (lethargic) and does not want to wake up and feed.  Your baby develops an unexplained fever. This information is not intended to replace advice given to you by your health care provider. Make sure you discuss any questions you have with your health care provider. Document Released: 02/01/2005 Document Revised: 07/16/2015 Document Reviewed: 07/26/2012 Elsevier Interactive Patient Education  2017 Elsevier Inc.  

## 2016-05-14 ENCOUNTER — Encounter (HOSPITAL_COMMUNITY): Payer: Self-pay

## 2016-05-14 ENCOUNTER — Other Ambulatory Visit (HOSPITAL_COMMUNITY): Payer: Self-pay | Admitting: Maternal and Fetal Medicine

## 2016-05-14 ENCOUNTER — Ambulatory Visit (HOSPITAL_COMMUNITY)
Admission: RE | Admit: 2016-05-14 | Discharge: 2016-05-14 | Disposition: A | Discharge status: Home / Self Care | Payer: Medicaid Other | Source: Ambulatory Visit | Attending: Family Medicine | Admitting: Family Medicine

## 2016-05-14 DIAGNOSIS — Z789 Other specified health status: Secondary | ICD-10-CM

## 2016-05-14 DIAGNOSIS — O358XX0 Maternal care for other (suspected) fetal abnormality and damage, not applicable or unspecified: Secondary | ICD-10-CM

## 2016-05-14 DIAGNOSIS — Z3A31 31 weeks gestation of pregnancy: Secondary | ICD-10-CM

## 2016-05-14 DIAGNOSIS — O0993 Supervision of high risk pregnancy, unspecified, third trimester: Secondary | ICD-10-CM

## 2016-05-14 DIAGNOSIS — I868 Varicose veins of other specified sites: Secondary | ICD-10-CM | POA: Diagnosis not present

## 2016-05-14 DIAGNOSIS — O26893 Other specified pregnancy related conditions, third trimester: Secondary | ICD-10-CM | POA: Insufficient documentation

## 2016-05-14 DIAGNOSIS — O2203 Varicose veins of lower extremity in pregnancy, third trimester: Secondary | ICD-10-CM | POA: Insufficient documentation

## 2016-05-14 DIAGNOSIS — O26879 Cervical shortening, unspecified trimester: Secondary | ICD-10-CM

## 2016-05-18 ENCOUNTER — Encounter (HOSPITAL_COMMUNITY): Payer: Self-pay

## 2016-05-18 ENCOUNTER — Ambulatory Visit (HOSPITAL_COMMUNITY)
Admission: RE | Admit: 2016-05-18 | Discharge: 2016-05-18 | Disposition: A | Discharge status: Home / Self Care | Payer: Medicaid Other | Source: Ambulatory Visit | Attending: Family Medicine | Admitting: Family Medicine

## 2016-05-18 DIAGNOSIS — O358XX0 Maternal care for other (suspected) fetal abnormality and damage, not applicable or unspecified: Secondary | ICD-10-CM | POA: Diagnosis not present

## 2016-05-18 DIAGNOSIS — Z3A31 31 weeks gestation of pregnancy: Secondary | ICD-10-CM | POA: Diagnosis not present

## 2016-05-21 ENCOUNTER — Other Ambulatory Visit (HOSPITAL_COMMUNITY): Payer: Self-pay | Admitting: Maternal and Fetal Medicine

## 2016-05-21 ENCOUNTER — Ambulatory Visit (HOSPITAL_COMMUNITY)
Admission: RE | Admit: 2016-05-21 | Discharge: 2016-05-21 | Disposition: A | Discharge status: Home / Self Care | Payer: Medicaid Other | Source: Ambulatory Visit | Attending: Family Medicine | Admitting: Family Medicine

## 2016-05-21 ENCOUNTER — Encounter (HOSPITAL_COMMUNITY): Payer: Self-pay

## 2016-05-21 DIAGNOSIS — Z3A31 31 weeks gestation of pregnancy: Secondary | ICD-10-CM

## 2016-05-21 DIAGNOSIS — O358XX0 Maternal care for other (suspected) fetal abnormality and damage, not applicable or unspecified: Secondary | ICD-10-CM

## 2016-05-21 DIAGNOSIS — Z3A32 32 weeks gestation of pregnancy: Secondary | ICD-10-CM | POA: Insufficient documentation

## 2016-05-25 ENCOUNTER — Ambulatory Visit (HOSPITAL_COMMUNITY): Payer: Medicaid Other

## 2016-05-25 ENCOUNTER — Ambulatory Visit (HOSPITAL_COMMUNITY)
Admission: RE | Admit: 2016-05-25 | Discharge: 2016-05-25 | Disposition: A | Discharge status: Home / Self Care | Payer: Medicaid Other | Source: Ambulatory Visit | Attending: Family Medicine | Admitting: Family Medicine

## 2016-05-25 ENCOUNTER — Ambulatory Visit (INDEPENDENT_AMBULATORY_CARE_PROVIDER_SITE_OTHER): Payer: Medicaid Other | Admitting: Obstetrics and Gynecology

## 2016-05-25 ENCOUNTER — Encounter (HOSPITAL_COMMUNITY): Payer: Self-pay

## 2016-05-25 VITALS — BP 112/77 | HR 105 | Wt 137.1 lb

## 2016-05-25 DIAGNOSIS — O0993 Supervision of high risk pregnancy, unspecified, third trimester: Secondary | ICD-10-CM

## 2016-05-25 DIAGNOSIS — O358XX0 Maternal care for other (suspected) fetal abnormality and damage, not applicable or unspecified: Secondary | ICD-10-CM | POA: Diagnosis not present

## 2016-05-25 DIAGNOSIS — Z789 Other specified health status: Secondary | ICD-10-CM

## 2016-05-25 DIAGNOSIS — Z3A32 32 weeks gestation of pregnancy: Secondary | ICD-10-CM | POA: Diagnosis not present

## 2016-05-25 DIAGNOSIS — O26873 Cervical shortening, third trimester: Secondary | ICD-10-CM

## 2016-05-25 DIAGNOSIS — O26879 Cervical shortening, unspecified trimester: Secondary | ICD-10-CM

## 2016-05-25 NOTE — Progress Notes (Signed)
Interpreter [place].

## 2016-05-25 NOTE — Progress Notes (Signed)
Prenatal Visit Note Date: 05/25/2016 Clinic: [facility]  Subjective:  Danielle Monroe is a 26 y.o. G1P0 at [redacted]w[redacted]d being seen today for ongoing prenatal care.  She is currently monitored for the following issues for this high-risk pregnancy and has Short cervix affecting pregnancy; Supervision of high-risk pregnancy; Language barrier; and Umbilical vein varix affecting pregnancy on her problem list.  Patient reports no complaints.   Contractions: Not present. Vag. Bleeding: None.  Movement: Present. Denies leaking of fluid.   The following portions of the patient's history were reviewed and updated as appropriate: allergies, current medications, past family history, past medical history, past social history, past surgical history and problem list. Problem list updated.  Objective:   Vitals:   05/25/16 1245  BP: 112/77  Pulse: (!) 105  Weight: 137 lb 1.6 oz (62.2 kg)    Fetal Status: Fetal Heart Rate (bpm): 154 Fundal Height: 32 cm Movement: Present  Presentation: Vertex  General:  Alert, oriented and cooperative. Patient is in no acute distress.  Skin: Skin is warm and dry. No rash noted.   Cardiovascular: Normal heart rate noted  Respiratory: Normal respiratory effort, no problems with respiration noted  Abdomen: Soft, gravid, appropriate for gestational age. Pain/Pressure: Absent     Pelvic:  Cervical exam deferred        Extremities: Normal range of motion.  Edema: None  Mental Status: Normal mood and affect. Normal behavior. Normal judgment and thought content.   Urinalysis:      Assessment and Plan:  Pregnancy: G1P0 at [redacted]w[redacted]d  1. Supervision of high risk pregnancy in third trimester Routine care. GBS nv.   2. Umbilical vein abnormality affecting pregnancy, single or unspecified fetus Getting qwk BPPs. 8/8 today. Has growth next week. 37wk delivery  3. Short cervix affecting pregnancy Has cerclage in place (placed in [place]). Remove with  IOL at 37wks  4. Language barrier Interpreter used (released signed already)  Preterm labor symptoms and general obstetric precautions including but not limited to vaginal bleeding, contractions, leaking of fluid and fetal movement were reviewed in detail with the patient. Please refer to After Visit Summary for other counseling recommendations.  Return in about 2 weeks (around 06/08/2016).   Winslow Bing, MD

## 2016-05-28 ENCOUNTER — Ambulatory Visit (HOSPITAL_COMMUNITY)
Admission: RE | Admit: 2016-05-28 | Discharge: 2016-05-28 | Disposition: A | Discharge status: Home / Self Care | Payer: Medicaid Other | Source: Ambulatory Visit | Attending: Family Medicine | Admitting: Family Medicine

## 2016-05-28 ENCOUNTER — Other Ambulatory Visit (HOSPITAL_COMMUNITY): Payer: Self-pay | Admitting: Maternal and Fetal Medicine

## 2016-05-28 ENCOUNTER — Encounter (HOSPITAL_COMMUNITY): Payer: Self-pay

## 2016-05-28 DIAGNOSIS — Z3689 Encounter for other specified antenatal screening: Secondary | ICD-10-CM | POA: Diagnosis present

## 2016-05-28 DIAGNOSIS — O358XX0 Maternal care for other (suspected) fetal abnormality and damage, not applicable or unspecified: Secondary | ICD-10-CM | POA: Diagnosis not present

## 2016-05-28 DIAGNOSIS — Z3A33 33 weeks gestation of pregnancy: Secondary | ICD-10-CM

## 2016-06-01 ENCOUNTER — Encounter (HOSPITAL_COMMUNITY): Payer: Self-pay

## 2016-06-01 ENCOUNTER — Other Ambulatory Visit (HOSPITAL_COMMUNITY): Payer: Self-pay | Admitting: Maternal and Fetal Medicine

## 2016-06-01 ENCOUNTER — Ambulatory Visit (HOSPITAL_COMMUNITY)
Admission: RE | Admit: 2016-06-01 | Discharge: 2016-06-01 | Disposition: A | Discharge status: Home / Self Care | Payer: Medicaid Other | Source: Ambulatory Visit | Attending: Family Medicine | Admitting: Family Medicine

## 2016-06-01 DIAGNOSIS — O26879 Cervical shortening, unspecified trimester: Secondary | ICD-10-CM

## 2016-06-01 DIAGNOSIS — Z3A33 33 weeks gestation of pregnancy: Secondary | ICD-10-CM | POA: Diagnosis not present

## 2016-06-01 DIAGNOSIS — O358XX0 Maternal care for other (suspected) fetal abnormality and damage, not applicable or unspecified: Secondary | ICD-10-CM

## 2016-06-01 DIAGNOSIS — Z362 Encounter for other antenatal screening follow-up: Secondary | ICD-10-CM | POA: Insufficient documentation

## 2016-06-01 DIAGNOSIS — O0993 Supervision of high risk pregnancy, unspecified, third trimester: Secondary | ICD-10-CM

## 2016-06-01 DIAGNOSIS — Z789 Other specified health status: Secondary | ICD-10-CM

## 2016-06-04 ENCOUNTER — Encounter (HOSPITAL_COMMUNITY): Payer: Self-pay

## 2016-06-04 ENCOUNTER — Ambulatory Visit (HOSPITAL_COMMUNITY)
Admission: RE | Admit: 2016-06-04 | Discharge: 2016-06-04 | Disposition: A | Discharge status: Home / Self Care | Payer: Medicaid Other | Source: Ambulatory Visit | Attending: Family Medicine | Admitting: Family Medicine

## 2016-06-04 DIAGNOSIS — Z3A34 34 weeks gestation of pregnancy: Secondary | ICD-10-CM | POA: Insufficient documentation

## 2016-06-04 DIAGNOSIS — O358XX0 Maternal care for other (suspected) fetal abnormality and damage, not applicable or unspecified: Secondary | ICD-10-CM | POA: Diagnosis present

## 2016-06-08 ENCOUNTER — Ambulatory Visit (HOSPITAL_COMMUNITY)
Admission: RE | Admit: 2016-06-08 | Discharge: 2016-06-08 | Disposition: A | Discharge status: Home / Self Care | Payer: Medicaid Other | Source: Ambulatory Visit | Attending: Family Medicine | Admitting: Family Medicine

## 2016-06-08 ENCOUNTER — Encounter (HOSPITAL_COMMUNITY): Payer: Self-pay

## 2016-06-08 ENCOUNTER — Ambulatory Visit (INDEPENDENT_AMBULATORY_CARE_PROVIDER_SITE_OTHER): Payer: Medicaid Other | Admitting: Obstetrics and Gynecology

## 2016-06-08 ENCOUNTER — Other Ambulatory Visit (HOSPITAL_COMMUNITY): Payer: Self-pay | Admitting: Maternal and Fetal Medicine

## 2016-06-08 VITALS — BP 117/74 | HR 101 | Wt 145.0 lb

## 2016-06-08 DIAGNOSIS — Z789 Other specified health status: Secondary | ICD-10-CM

## 2016-06-08 DIAGNOSIS — O358XX0 Maternal care for other (suspected) fetal abnormality and damage, not applicable or unspecified: Secondary | ICD-10-CM

## 2016-06-08 DIAGNOSIS — Z3A34 34 weeks gestation of pregnancy: Secondary | ICD-10-CM | POA: Insufficient documentation

## 2016-06-08 DIAGNOSIS — O0993 Supervision of high risk pregnancy, unspecified, third trimester: Secondary | ICD-10-CM

## 2016-06-08 NOTE — Progress Notes (Signed)
   PRENATAL VISIT NOTE  Subjective:  Danielle Monroe is a 26 y.o. G1P0 at [redacted]w[redacted]d being seen today for ongoing prenatal care.  She is currently monitored for the following issues for this high-risk pregnancy and has Short cervix affecting pregnancy; Supervision of high-risk pregnancy; Language barrier; and Umbilical vein varix affecting pregnancy on her problem list.  Patient reports no complaints.  Contractions: Not present. Vag. Bleeding: None.  Movement: Present. Denies leaking of fluid.   The following portions of the patient's history were reviewed and updated as appropriate: allergies, current medications, past family history, past medical history, past social history, past surgical history and problem list. Problem list updated.  Objective:   Vitals:   06/08/16 0908  BP: 117/74  Pulse: (!) 101  Weight: 145 lb (65.8 kg)    Fetal Status: Fetal Heart Rate (bpm): 131   Movement: Present     General:  Alert, oriented and cooperative. Patient is in no acute distress.  Skin: Skin is warm and dry. No rash noted.   Cardiovascular: Normal heart rate noted  Respiratory: Normal respiratory effort, no problems with respiration noted  Abdomen: Soft, gravid, appropriate for gestational age. Pain/Pressure: Absent     Pelvic:  Cervical exam deferred        Extremities: Normal range of motion.  Edema: None  Mental Status: Normal mood and affect. Normal behavior. Normal judgment and thought content.   Assessment and Plan:  Pregnancy: G1P0 at [redacted]w[redacted]d  1. Supervision of high risk pregnancy in third trimester Doing well. Continue Ultrasound per MFM.  -36 wk cultures at next visit.    2. Umbilical vein abnormality affecting pregnancy, single or unspecified fetus Set up for 37 wk induction today - remove cerclage at time of induction  3. Language barrier Aunt used - patient does understand some english  Preterm labor symptoms and general obstetric precautions including but not  limited to vaginal bleeding, contractions, leaking of fluid and fetal movement were reviewed in detail with the patient. Please refer to After Visit Summary for other counseling recommendations.  Return in about 2 weeks (around 06/22/2016).   Lorne Skeens, MD

## 2016-06-08 NOTE — Patient Instructions (Addendum)
Safe Medications in Pregnancy   *NO pepto Bismol   Hemorrhoids:  Anusol  Anusol HC  Preparation H  Tucks   Indigestion:  Tums  Maalox  Mylanta  Zantac  Pepcid     Leg Cramps:  Tums  MagGel   Nausea/Vomiting:  Bonine  Dramamine  Emetrol  Ginger extract  Sea bands  Meclizine  Nausea medication to take during pregnancy:  Unisom (doxylamine succinate 25 mg tablets) Take one tablet daily at bedtime. If symptoms are not adequately controlled, the dose can be increased to a maximum recommended dose of two tablets daily (1/2 tablet in the morning, 1/2 tablet mid-afternoon and one at bedtime).  Vitamin B6  tablets. Take one tablet twice a day (up to 200 mg per day).     **If taking multiple medications, please check labels to avoid duplicating the same active ingredients  **take medication as directed on the label  ** Do not exceed 4000 mg of tylenol in 24 hours  **Do not take medications that contain aspirin or ibuprofen          Augmentation of Labor Augmentation of labor is when steps are taken to stimulate and strengthen uterine contractions during labor. This may be done when the contractions have slowed down or stopped, delaying progress of labor and delivery of the baby. Before beginning augmentation of labor, the health care provider will evaluate the condition of the mother and baby, the size and position of the baby, and the size of the birth canal. What are the reasons for labor augmentation? Reasons for augmentation of labor include:  Slow labor (prolonged first and second stage of labor) that has been associated with increased maternal risks, such as chorioamnionitis, postpartum hemorrhage, operative vaginal delivery, or third-degree or fourth-degree perineal lacerations.  Decreased average length of labor. What methods are used for labor augmentation? Various methods may be used for augmentation of labor, including:  Oxytocin medicine. This medicine  stimulates contractions. It is given through an IV access tube inserted into a vein.  Breaking the fluid-filled sac that surrounds the fetus (amniotic sac).  Stripping the membranes. The health care provider separates amniotic sac tissue from the cervix, causing the release of a hormone called progesterone that can stimulate uterine contractions.  Nipple stimulation.  Stimulation of certain pressure points on the ankles.  Manual or mechanical dilation of the cervix. What are the risks associated with labor augmentation?  Overstimulation of the uterine contractions (continuous, prolonged, very strong contractions), causing fetal distress.  Increased chance of infection for the mother and baby.  Uterine tearing (rupture).  Breaking off (abruption) of the placenta.  Increased chance of cesarean, forceps, or vacuum delivery. What are some reasons for not doing labor augmentation? Augmentation of labor should not be done if:  The baby is too big for the birth canal. This can be confirmed by ultrasonography.  The umbilical cord drops in front of the baby's head or breech part (prolapsed cord).  The mother had a previous cesarean delivery with a vertical incision in the uterus (or the kind of incision used is not known). High dose oxytocin should not be used if the mother had a previous cesarean delivery of any kind.  The mother had previous surgery on or into the uterus.  The mother has herpes.  The mother has cervical cancer.  The baby is lying sideways.  The mother's pelvis is deformed.  The mother is pregnant with more than two babies. This information is not intended to  replace advice given to you by your health care provider. Make sure you discuss any questions you have with your health care provider. Document Released: 07/27/2006 Document Revised: 07/16/2015 Document Reviewed: 08/31/2012 Elsevier Interactive Patient Education  2017 [employer].

## 2016-06-11 ENCOUNTER — Other Ambulatory Visit (HOSPITAL_COMMUNITY): Payer: Self-pay | Admitting: Maternal and Fetal Medicine

## 2016-06-11 ENCOUNTER — Ambulatory Visit (HOSPITAL_COMMUNITY)
Admission: RE | Admit: 2016-06-11 | Discharge: 2016-06-11 | Disposition: A | Discharge status: Home / Self Care | Payer: Medicaid Other | Source: Ambulatory Visit | Attending: Family Medicine | Admitting: Family Medicine

## 2016-06-11 ENCOUNTER — Encounter (HOSPITAL_COMMUNITY): Payer: Self-pay

## 2016-06-11 DIAGNOSIS — O358XX0 Maternal care for other (suspected) fetal abnormality and damage, not applicable or unspecified: Secondary | ICD-10-CM

## 2016-06-11 DIAGNOSIS — Z3A35 35 weeks gestation of pregnancy: Secondary | ICD-10-CM | POA: Diagnosis not present

## 2016-06-15 ENCOUNTER — Other Ambulatory Visit (HOSPITAL_COMMUNITY): Payer: Self-pay | Admitting: Maternal and Fetal Medicine

## 2016-06-15 ENCOUNTER — Encounter (HOSPITAL_COMMUNITY): Payer: Self-pay

## 2016-06-15 ENCOUNTER — Ambulatory Visit (HOSPITAL_COMMUNITY)
Admission: RE | Admit: 2016-06-15 | Discharge: 2016-06-15 | Disposition: A | Discharge status: Home / Self Care | Payer: Medicaid Other | Source: Ambulatory Visit | Attending: Family Medicine | Admitting: Family Medicine

## 2016-06-15 DIAGNOSIS — Z3A35 35 weeks gestation of pregnancy: Secondary | ICD-10-CM | POA: Insufficient documentation

## 2016-06-15 DIAGNOSIS — O358XX0 Maternal care for other (suspected) fetal abnormality and damage, not applicable or unspecified: Secondary | ICD-10-CM | POA: Insufficient documentation

## 2016-06-18 ENCOUNTER — Ambulatory Visit (HOSPITAL_COMMUNITY)
Admission: RE | Admit: 2016-06-18 | Discharge: 2016-06-18 | Disposition: A | Discharge status: Home / Self Care | Payer: Medicaid Other | Source: Ambulatory Visit | Attending: Family Medicine | Admitting: Family Medicine

## 2016-06-18 ENCOUNTER — Encounter (HOSPITAL_COMMUNITY): Payer: Self-pay

## 2016-06-18 DIAGNOSIS — O358XX0 Maternal care for other (suspected) fetal abnormality and damage, not applicable or unspecified: Secondary | ICD-10-CM | POA: Diagnosis not present

## 2016-06-18 DIAGNOSIS — Z3A36 36 weeks gestation of pregnancy: Secondary | ICD-10-CM | POA: Diagnosis not present

## 2016-06-18 NOTE — Addendum Note (Signed)
Encounter addended by: Raoul PitchLora Mae Lynniah Janoski on: 06/18/2016 12:03 PM<BR>    Actions taken: Imaging Exam ended

## 2016-06-22 ENCOUNTER — Encounter (HOSPITAL_COMMUNITY): Payer: Self-pay

## 2016-06-22 ENCOUNTER — Other Ambulatory Visit (HOSPITAL_COMMUNITY)
Admission: RE | Admit: 2016-06-22 | Discharge: 2016-06-22 | Disposition: A | Discharge status: Home / Self Care | Payer: Medicaid Other | Source: Ambulatory Visit | Attending: Advanced Practice Midwife | Admitting: Advanced Practice Midwife

## 2016-06-22 ENCOUNTER — Other Ambulatory Visit (HOSPITAL_COMMUNITY): Payer: Self-pay | Admitting: Maternal and Fetal Medicine

## 2016-06-22 ENCOUNTER — Ambulatory Visit (HOSPITAL_COMMUNITY)
Admission: RE | Admit: 2016-06-22 | Discharge: 2016-06-22 | Disposition: A | Discharge status: Home / Self Care | Payer: Medicaid Other | Source: Ambulatory Visit | Attending: Family Medicine | Admitting: Family Medicine

## 2016-06-22 ENCOUNTER — Ambulatory Visit (INDEPENDENT_AMBULATORY_CARE_PROVIDER_SITE_OTHER): Payer: Medicaid Other | Admitting: Advanced Practice Midwife

## 2016-06-22 VITALS — BP 122/82 | HR 90 | Wt 146.2 lb

## 2016-06-22 DIAGNOSIS — O358XX0 Maternal care for other (suspected) fetal abnormality and damage, not applicable or unspecified: Secondary | ICD-10-CM

## 2016-06-22 DIAGNOSIS — Z789 Other specified health status: Secondary | ICD-10-CM

## 2016-06-22 DIAGNOSIS — Z3A00 Weeks of gestation of pregnancy not specified: Secondary | ICD-10-CM | POA: Diagnosis not present

## 2016-06-22 DIAGNOSIS — O0993 Supervision of high risk pregnancy, unspecified, third trimester: Secondary | ICD-10-CM | POA: Insufficient documentation

## 2016-06-22 DIAGNOSIS — O359XX0 Maternal care for (suspected) fetal abnormality and damage, unspecified, not applicable or unspecified: Secondary | ICD-10-CM | POA: Diagnosis present

## 2016-06-22 DIAGNOSIS — Z3A36 36 weeks gestation of pregnancy: Secondary | ICD-10-CM

## 2016-06-22 LAB — OB RESULTS CONSOLE GBS: GBS: NEGATIVE

## 2016-06-22 LAB — OB RESULTS CONSOLE GC/CHLAMYDIA: Gonorrhea: NEGATIVE

## 2016-06-22 NOTE — Patient Instructions (Addendum)
Labor Induction Labor induction is when steps are taken to cause a pregnant woman to begin the labor process. Most women go into labor on their own between 37 weeks and 42 weeks of the pregnancy. When this does not happen or when there is a medical need, methods may be used to induce labor. Labor induction causes a pregnant woman's uterus to contract. It also causes the cervix to soften (ripen), open (dilate), and thin out (efface). Usually, labor is not induced before 39 weeks of the pregnancy unless there is a problem with the baby or mother. Before inducing labor, your health care provider will consider a number of factors, including the following:  The medical condition of you and the baby.  How many weeks along you are.  The status of the baby's lung maturity.  The condition of the cervix.  The position of the baby. What are the reasons for labor induction? Labor may be induced for the following reasons:  The health of the baby or mother is at risk.  The pregnancy is overdue by 1 week or more.  The water breaks but labor does not start on its own.  The mother has a health condition or serious illness, such as high blood pressure, infection, placental abruption, or diabetes.  The amniotic fluid amounts are low around the baby.  The baby is distressed. Convenience or wanting the baby to be born on a certain date is not a reason for inducing labor. What methods are used for labor induction? Several methods of labor induction may be used, such as:  Prostaglandin medicine. This medicine causes the cervix to dilate and ripen. The medicine will also start contractions. It can be taken by mouth or by inserting a suppository into the vagina.  Inserting a thin tube (catheter) with a balloon on the end into the vagina to dilate the cervix. Once inserted, the balloon is expanded with water, which causes the cervix to open.  Stripping the membranes. Your health care provider separates  amniotic sac tissue from the cervix, causing the cervix to be stretched and causing the release of a hormone called progesterone. This may cause the uterus to contract. It is often done during an office visit. You will be sent home to wait for the contractions to begin. You will then come in for an induction.  Breaking the water. Your health care provider makes a hole in the amniotic sac using a small instrument. Once the amniotic sac breaks, contractions should begin. This may still take hours to see an effect.  Medicine to trigger or strengthen contractions. This medicine is given through an IV access tube inserted into a vein in your arm. All of the methods of induction, besides stripping the membranes, will be done in the hospital. Induction is done in the hospital so that you and the baby can be carefully monitored. How long does it take for labor to be induced? Some inductions can take up to 2-3 days. Depending on the cervix, it usually takes less time. It takes longer when you are induced early in the pregnancy or if this is your first pregnancy. If a mother is still pregnant and the induction has been going on for 2-3 days, either the mother will be sent home or a cesarean delivery will be needed. What are the risks associated with labor induction? Some of the risks of induction include:  Changes in fetal heart rate, such as too high, too low, or erratic.  Fetal distress.    Chance of infection for the mother and baby.  Increased chance of having a cesarean delivery.  Breaking off (abruption) of the placenta from the uterus (rare).  Uterine rupture (very rare). When induction is needed for medical reasons, the benefits of induction may outweigh the risks. What are some reasons for not inducing labor? Labor induction should not be done if:  It is shown that your baby does not tolerate labor.  You have had previous surgeries on your uterus, such as a myomectomy or the removal of  fibroids.  Your placenta lies very low in the uterus and blocks the opening of the cervix (placenta previa).  Your baby is not in a head-down position.  The umbilical cord drops down into the birth canal in front of the baby. This could cut off the baby's blood and oxygen supply.  You have had a previous cesarean delivery.  There are unusual circumstances, such as the baby being extremely premature. This information is not intended to replace advice given to you by your health care provider. Make sure you discuss any questions you have with your health care provider. Document Released: 06/23/2006 Document Revised: 07/10/2015 Document Reviewed: 08/31/2012 Elsevier Interactive Patient Education  2017 Elsevier Inc.  

## 2016-06-22 NOTE — Progress Notes (Signed)
Pt's aunt interpreting Gujarati 36 wk cultures today

## 2016-06-22 NOTE — Progress Notes (Signed)
   PRENATAL VISIT NOTE  Subjective:  Danielle Monroe is a 26 y.o. G1P0 at 4161w4d being seen today for ongoing prenatal care.  She is currently monitored for the following issues for this high-risk pregnancy and has Short cervix affecting pregnancy; Supervision of high-risk pregnancy; Language barrier; and Umbilical vein varix affecting pregnancy on her problem list.  Patient reports no complaints.  Contractions: Irritability. Vag. Bleeding: None.  Movement: Present. Denies leaking of fluid.   The following portions of the patient's history were reviewed and updated as appropriate: allergies, current medications, past family history, past medical history, past social history, past surgical history and problem list. Problem list updated.  Objective:   Vitals:   06/22/16 0949  BP: 122/82  Pulse: 90  Weight: 146 lb 3.2 oz (66.3 kg)    Fetal Status: Fetal Heart Rate (bpm): 144 Fundal Height: 37 cm Movement: Present  Presentation: Vertex  General:  Alert, oriented and cooperative. Patient is in no acute distress.  Skin: Skin is warm and dry. No rash noted.   Cardiovascular: Normal heart rate noted  Respiratory: Normal respiratory effort, no problems with respiration noted  Abdomen: Soft, gravid, appropriate for gestational age. Pain/Pressure: Present     Pelvic:  Cervical exam performed Dilation: Closed Effacement (%): 70 Station: -2  Extremities: Normal range of motion.  Edema: None  Mental Status: Normal mood and affect. Normal behavior. Normal judgment and thought content.   Assessment and Plan:  Pregnancy: G1P0 at 861w4d  1. Supervision of high risk pregnancy, antepartum, third trimester  - Cervicovaginal ancillary only - Strep Gp B NAA  Term labor symptoms and general obstetric precautions including but not limited to vaginal bleeding, contractions, leaking of fluid and fetal movement were reviewed in detail with the patient. Please refer to After Visit Summary for  other counseling recommendations.  IOL scheduled 5/11/8 for UVV. Would like to more it to [place] induction if slot becomes es available.  Return in about 6 weeks (around 08/03/2016) for Postpartum visit.   Katrinka BlazingSmith, [place], CNM

## 2016-06-23 ENCOUNTER — Other Ambulatory Visit: Payer: Self-pay | Admitting: Advanced Practice Midwife

## 2016-06-23 LAB — CERVICOVAGINAL ANCILLARY ONLY
Chlamydia: NEGATIVE
Neisseria Gonorrhea: NEGATIVE

## 2016-06-24 ENCOUNTER — Telehealth (HOSPITAL_COMMUNITY): Payer: Self-pay | Admitting: *Deleted

## 2016-06-24 LAB — STREP GP B NAA: Strep Gp B NAA: NEGATIVE

## 2016-06-24 NOTE — Telephone Encounter (Signed)
Preadmission screen Interpreter number  

## 2016-06-25 ENCOUNTER — Inpatient Hospital Stay (HOSPITAL_COMMUNITY)
Admission: RE | Admit: 2016-06-25 | Discharge: 2016-06-27 | DRG: 982 | Disposition: A | Discharge status: Home / Self Care | Payer: Medicaid Other | Source: Ambulatory Visit | Attending: Family Medicine | Admitting: Family Medicine

## 2016-06-25 ENCOUNTER — Inpatient Hospital Stay (HOSPITAL_COMMUNITY): Admission: RE | Admit: 2016-06-25 | Payer: Medicaid Other | Source: Ambulatory Visit

## 2016-06-25 ENCOUNTER — Other Ambulatory Visit (HOSPITAL_COMMUNITY): Payer: Medicaid Other

## 2016-06-25 ENCOUNTER — Encounter (HOSPITAL_COMMUNITY): Payer: Self-pay

## 2016-06-25 DIAGNOSIS — O26873 Cervical shortening, third trimester: Secondary | ICD-10-CM | POA: Diagnosis present

## 2016-06-25 DIAGNOSIS — Z789 Other specified health status: Secondary | ICD-10-CM

## 2016-06-25 DIAGNOSIS — O0993 Supervision of high risk pregnancy, unspecified, third trimester: Secondary | ICD-10-CM

## 2016-06-25 DIAGNOSIS — Z3A37 37 weeks gestation of pregnancy: Secondary | ICD-10-CM | POA: Diagnosis not present

## 2016-06-25 DIAGNOSIS — O358XX0 Maternal care for other (suspected) fetal abnormality and damage, not applicable or unspecified: Secondary | ICD-10-CM | POA: Diagnosis present

## 2016-06-25 DIAGNOSIS — O26879 Cervical shortening, unspecified trimester: Secondary | ICD-10-CM

## 2016-06-25 LAB — CBC
HCT: 30.7 % — ABNORMAL LOW (ref 36.0–46.0)
Hemoglobin: 9.8 g/dL — ABNORMAL LOW (ref 12.0–15.0)
MCH: 24.6 pg — ABNORMAL LOW (ref 26.0–34.0)
MCHC: 31.9 g/dL (ref 30.0–36.0)
MCV: 77.1 fL — ABNORMAL LOW (ref 78.0–100.0)
Platelets: 266 10*3/uL (ref 150–400)
RBC: 3.98 MIL/uL (ref 3.87–5.11)
RDW: 16.3 % — ABNORMAL HIGH (ref 11.5–15.5)
WBC: 8.3 10*3/uL (ref 4.0–10.5)

## 2016-06-25 LAB — TYPE AND SCREEN
ABO/RH(D): O POS
Antibody Screen: NEGATIVE

## 2016-06-25 LAB — ABO/RH: ABO/RH(D): O POS

## 2016-06-25 LAB — RPR: RPR Ser Ql: NONREACTIVE

## 2016-06-25 MED ORDER — LACTATED RINGERS IV SOLN
INTRAVENOUS | Status: DC
  Administered 2016-06-25 (×2): via INTRAVENOUS

## 2016-06-25 MED ORDER — SIMETHICONE 80 MG PO CHEW: 80.0000 mg | CHEWABLE_TABLET | ORAL | Status: DC | PRN

## 2016-06-25 MED ORDER — TERBUTALINE SULFATE 1 MG/ML IJ SOLN
0.2500 mg | Freq: Once | INTRAMUSCULAR | Status: DC | PRN
  Filled 2016-06-25: qty 1

## 2016-06-25 MED ORDER — ONDANSETRON HCL 4 MG PO TABS: 4.0000 mg | ORAL_TABLET | ORAL | Status: DC | PRN

## 2016-06-25 MED ORDER — DIBUCAINE 1 % RE OINT: 1.0000 "application " | TOPICAL_OINTMENT | RECTAL | Status: DC | PRN

## 2016-06-25 MED ORDER — ONDANSETRON HCL 4 MG/2ML IJ SOLN: 4.0000 mg | Freq: Four times a day (QID) | INTRAMUSCULAR | Status: DC | PRN

## 2016-06-25 MED ORDER — LACTATED RINGERS IV SOLN: 500.0000 mL | Freq: Once | INTRAVENOUS | Status: DC

## 2016-06-25 MED ORDER — OXYTOCIN BOLUS FROM INFUSION: 500.0000 mL | Freq: Once | INTRAVENOUS | Status: DC

## 2016-06-25 MED ORDER — OXYCODONE HCL 5 MG PO TABS: 5.0000 mg | ORAL_TABLET | ORAL | Status: DC | PRN

## 2016-06-25 MED ORDER — LACTATED RINGERS IV SOLN: 500.0000 mL | INTRAVENOUS | Status: DC | PRN

## 2016-06-25 MED ORDER — WITCH HAZEL-GLYCERIN EX PADS: 1.0000 "application " | MEDICATED_PAD | CUTANEOUS | Status: DC | PRN

## 2016-06-25 MED ORDER — TETANUS-DIPHTH-ACELL PERTUSSIS 5-2.5-18.5 LF-MCG/0.5 IM SUSP: 0.5000 mL | Freq: Once | INTRAMUSCULAR | Status: DC

## 2016-06-25 MED ORDER — OXYCODONE-ACETAMINOPHEN 5-325 MG PO TABS: 1.0000 | ORAL_TABLET | ORAL | Status: DC | PRN

## 2016-06-25 MED ORDER — IBUPROFEN 600 MG PO TABS
600.0000 mg | ORAL_TABLET | Freq: Four times a day (QID) | ORAL | Status: DC
  Administered 2016-06-25 – 2016-06-27 (×7): 600 mg via ORAL
  Filled 2016-06-25 (×7): qty 1

## 2016-06-25 MED ORDER — DIPHENHYDRAMINE HCL 50 MG/ML IJ SOLN: 12.5000 mg | INTRAMUSCULAR | Status: DC | PRN

## 2016-06-25 MED ORDER — COCONUT OIL OIL: 1.0000 "application " | TOPICAL_OIL | Status: DC | PRN

## 2016-06-25 MED ORDER — EPHEDRINE 5 MG/ML INJ
10.0000 mg | INTRAVENOUS | Status: DC | PRN
  Filled 2016-06-25: qty 2

## 2016-06-25 MED ORDER — OXYCODONE-ACETAMINOPHEN 5-325 MG PO TABS: 2.0000 | ORAL_TABLET | ORAL | Status: DC | PRN

## 2016-06-25 MED ORDER — ONDANSETRON HCL 4 MG/2ML IJ SOLN
4.0000 mg | INTRAMUSCULAR | Status: DC | PRN
Start: 2016-06-25 — End: 2016-06-27

## 2016-06-25 MED ORDER — SOD CITRATE-CITRIC ACID 500-334 MG/5ML PO SOLN: 30.0000 mL | ORAL | Status: DC | PRN

## 2016-06-25 MED ORDER — ZOLPIDEM TARTRATE 5 MG PO TABS: 5.0000 mg | ORAL_TABLET | Freq: Every evening | ORAL | Status: DC | PRN

## 2016-06-25 MED ORDER — PHENYLEPHRINE 40 MCG/ML (10ML) SYRINGE FOR IV PUSH (FOR BLOOD PRESSURE SUPPORT)
80.0000 ug | PREFILLED_SYRINGE | INTRAVENOUS | Status: DC | PRN
  Filled 2016-06-25: qty 5

## 2016-06-25 MED ORDER — MISOPROSTOL 25 MCG QUARTER TABLET
25.0000 ug | ORAL_TABLET | ORAL | Status: DC | PRN
  Filled 2016-06-25: qty 1

## 2016-06-25 MED ORDER — OXYTOCIN 40 UNITS IN LACTATED RINGERS INFUSION - SIMPLE MED
2.5000 [IU]/h | INTRAVENOUS | Status: DC
  Administered 2016-06-25: 2.5 [IU]/h via INTRAVENOUS

## 2016-06-25 MED ORDER — FENTANYL CITRATE (PF) 100 MCG/2ML IJ SOLN
100.0000 ug | INTRAMUSCULAR | Status: DC | PRN
  Administered 2016-06-25 (×2): 100 ug via INTRAVENOUS
  Filled 2016-06-25 (×2): qty 2

## 2016-06-25 MED ORDER — ACETAMINOPHEN 325 MG PO TABS: 650.0000 mg | ORAL_TABLET | ORAL | Status: DC | PRN

## 2016-06-25 MED ORDER — SENNOSIDES-DOCUSATE SODIUM 8.6-50 MG PO TABS
2.0000 | ORAL_TABLET | ORAL | Status: DC
  Administered 2016-06-26: 2 via ORAL
  Filled 2016-06-25: qty 2

## 2016-06-25 MED ORDER — PRENATAL MULTIVITAMIN CH
1.0000 | ORAL_TABLET | Freq: Every day | ORAL | Status: DC
  Administered 2016-06-26 – 2016-06-27 (×2): 1 via ORAL
  Filled 2016-06-25 (×2): qty 1

## 2016-06-25 MED ORDER — ACETAMINOPHEN 325 MG PO TABS
650.0000 mg | ORAL_TABLET | ORAL | Status: DC | PRN
  Administered 2016-06-26: 650 mg via ORAL
  Filled 2016-06-25: qty 2

## 2016-06-25 MED ORDER — DIPHENHYDRAMINE HCL 25 MG PO CAPS: 25.0000 mg | ORAL_CAPSULE | Freq: Four times a day (QID) | ORAL | Status: DC | PRN

## 2016-06-25 MED ORDER — LIDOCAINE HCL (PF) 1 % IJ SOLN
30.0000 mL | INTRAMUSCULAR | Status: DC | PRN
  Administered 2016-06-25: 30 mL via SUBCUTANEOUS
  Filled 2016-06-25: qty 30

## 2016-06-25 MED ORDER — OXYTOCIN 40 UNITS IN LACTATED RINGERS INFUSION - SIMPLE MED
1.0000 m[IU]/min | INTRAVENOUS | Status: DC
  Administered 2016-06-25: 2 m[IU]/min via INTRAVENOUS
  Filled 2016-06-25: qty 1000

## 2016-06-25 MED ORDER — FENTANYL 2.5 MCG/ML BUPIVACAINE 1/10 % EPIDURAL INFUSION (WH - ANES): 14.0000 mL/h | INTRAMUSCULAR | Status: DC | PRN

## 2016-06-25 MED ORDER — BENZOCAINE-MENTHOL 20-0.5 % EX AERO
1.0000 "application " | INHALATION_SPRAY | CUTANEOUS | Status: DC | PRN
  Filled 2016-06-25: qty 56

## 2016-06-25 NOTE — Progress Notes (Signed)
Patient ID: Danielle Monroe, female   DOB: 06-May-1990, 26 y.o.   MRN: [id number]  Coping well w/ ctx; requesting AROM BP 132/86, other VSS FHR 118-125, +accels, occ mi variables Ctx q 2-3 mins w/ Pit @ 6312mu/min Cx 5/100/-1; SROM for clear fluid  IUP@term  UVV Cx favorable  Plan to check cx in 1-2 hrs Anticipate SVD  Cam HaiSHAW, Tanaysha Alkins CNM 06/25/2016 3:21 PM

## 2016-06-25 NOTE — Anesthesia Pain Management Evaluation Note (Signed)
  CRNA Pain Management Visit Note  Patient: Danielle Monroe, 26 y.o., female  "Hello I am a member of the anesthesia team at [facility]. We have an anesthesia team available at all times to provide care throughout the hospital, including epidural management and anesthesia for C-section. I don't know your plan for the delivery whether it a natural birth, water birth, IV sedation, nitrous supplementation, doula or epidural, but we want to meet your pain goals."   1.Was your pain managed to your expectations on prior hospitalizations?   No   2.What is your expectation for pain management during this hospitalization?     Epidural  3.How can we help you reach that goal?iv rx, epidural  Record the patient's initial score and the patient's pain goal.   Pain: 10  Pain Goal: 10 [facility] wants you to be able to say your pain was always managed very well.  [name] 06/25/2016

## 2016-06-25 NOTE — H&P (Signed)
LABOR AND DELIVERY ADMISSION HISTORY AND PHYSICAL NOTE  Danielle Monroe is a 26 y.o. female G1P0 with IUP at 1256w0d by LMP presenting for IOL 2/2 umbilical vein varix; also with short cervix with cerclage (placed in [place]). Patient received part of her prenatal care in [place]. Patient has a planned cerclage removal today prior to induction, otherwise pregnancy course has been uncomplicated.  She reports positive fetal movement. She denies leakage of fluid or vaginal bleeding.  Prenatal History/Complications:  Past Medical History: Past Medical History:  Diagnosis Date  . Medical history non-contributory     Past Surgical History: Past Surgical History:  Procedure Laterality Date  . CERVICAL CERCLAGE  02/2016    Obstetrical History: OB History    Gravida Para Term Preterm AB Living   1         0   SAB TAB Ectopic Multiple Live Births                  Social History: Social History   Social History  . Marital status: Married    Spouse name: N/A  . Number of children: N/A  . Years of education: N/A   Social History Main Topics  . Smoking status: Never Smoker  . Smokeless tobacco: Never Used  . Alcohol use No  . Drug use: No  . Sexual activity: Yes    Birth control/ protection: None   Other Topics Concern  . None   Social History Narrative  . None    Family History: History reviewed. No pertinent family history.  Allergies: No Known Allergies  Prescriptions Prior to Admission  Medication Sig Dispense Refill Last Dose  . calcium carbonate (TUMS - DOSED IN MG ELEMENTAL CALCIUM) 500 MG chewable tablet Chew 1 tablet by mouth as needed for indigestion or heartburn.   Taking  . Prenatal Multivit-Min-Fe-FA (PRENATAL VITAMINS PO) Take 1 tablet by mouth daily.   Taking     Review of Systems   All systems reviewed and negative except as stated in HPI  Last menstrual period 09/28/2015. General appearance: alert, cooperative and appears stated  age Lungs: Normal respiratory effort, no audible wheezing Heart: regular rate and pulses palpated bilaterally upper and lower extremities  Abdomen: soft, non-tender; gravid abdomen appropriate for gestational age Extremities: No calf swelling or tenderness Presentation: cephalic by nurse exam  Fetal monitoring: FHR 140, good variability, + accel, no decel Uterine activity: Irregular Dilation: 2.5 Effacement (%): 70 Station: -2 Exam by:: Dr. 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[place]: Within normal limit except for Umbilical vein varix  Prenatal Transfer Tool  Maternal Diabetes: No Genetic Screening: Abnormal:  Results: Other: [facility] Ultrasounds/Referrals: Abnormal:  Findings:   Other:Umbilical vein varix Fetal Ultrasounds or other Referrals:  None Maternal Substance Abuse:  No Significant Maternal Medications:  None Significant Maternal Lab Results: Lab values include: Group B Strep negative  Results for orders placed or performed during the hospital encounter of 06/25/16 (from the past 24 hour(s))  CBC   Collection Time: 06/25/16  8:40 AM  Result Value Ref Range   WBC 8.3 4.0 - 10.5 K/uL   RBC 3.98 3.87 - 5.11 MIL/uL   Hemoglobin 9.8 (L) 12.0 -  15.0 g/dL   HCT 81.1 (L) 91.4 - 78.2 %   MCV 77.1 (L) 78.0 - 100.0 fL   MCH 24.6 (L) 26.0 - 34.0 pg   MCHC 31.9 30.0 - 36.0 g/dL   RDW 95.6 (H) 21.3 - 08.6 %   Platelets 266 150 - 400 K/uL    Patient Active Problem List   Diagnosis Date Noted  . 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 Short cervix affecting pregnancy 04/13/2016  . Supervision of high-risk pregnancy 04/13/2016  . Language barrier 04/13/2016     Assessment: Danielle Monroe is a 26 y.o. G1P0 at [redacted]w[redacted]d here for IOL of labor for short cervix and umbilical vein varix. Patient tolerated cerclage removal.  #Labor: Patient has been started on pitocin, will continue with induction #Pain: IV pain meds, epidural upon maternal request. #FWB: Cat 1 #ID:  GBS neg  #MOF: Breast  #MOC: None  #Circ:  Baby gender is unknown  Lovena Neighbours, PGY-1 06/25/2016, 9:37 AM  CNM attestation:  I have seen and examined this patient; I agree with above documentation in the resident's note.   Danielle Monroe is a 26 y.o. G1P0 here for IOL due to umbilical vein varix.  PE: BP 137/85   Pulse 82   Resp 16   LMP 09/28/2015  Gen: calm comfortable, NAD Resp: normal effort, no distress Abd: gravid  ROS, labs, PMH reviewed  Plan: Admit to [facility] Cervical cerclage removed by Dr Shawnie Pons upon pt arrival Pitocin running for IOL method Anticipate SVD  Cam Hai CNM 06/25/2016, 11:21 AM

## 2016-06-25 NOTE — Progress Notes (Addendum)
Patient ID: Danielle Monroe, female   DOB: Mar 25, 1990, 26 y.o.   MRN: [id number] Danielle Monroe is an 26 y.o. G1P0 7256w0d female.   Chief Complaint: IOL with cerclage removal HPI: Patient has a cerclage and is for IOL due to umbilical vein varix per MFM.  Past Medical History:  Diagnosis Date  . Medical history non-contributory     Past Surgical History:  Procedure Laterality Date  . CERVICAL CERCLAGE  02/2016    History reviewed. No pertinent family history. Social History:  reports that she has never smoked. She has never used smokeless tobacco. She reports that she does not drink alcohol or use drugs.   No Known Allergies  No current facility-administered medications on file prior to encounter.    Current Outpatient Prescriptions on File Prior to Encounter  Medication Sig Dispense Refill  . calcium carbonate (TUMS - DOSED IN MG ELEMENTAL CALCIUM) 500 MG chewable tablet Chew 1 tablet by mouth as needed for indigestion or heartburn.    . Prenatal Multivit-Min-Fe-FA (PRENATAL VITAMINS PO) Take 1 tablet by mouth daily.      A comprehensive review of systems was negative.  Last menstrual period 09/28/2015. LMP 09/28/2015  General appearance: alert, cooperative and appears older than stated age Head: Normocephalic, without obvious abnormality, atraumatic Neck: supple, symmetrical, trachea midline Lungs: normal effort Heart: regular rate and rhythm Abdomen: soft, non-tender; bowel sounds normal; no masses,  no organomegaly Extremities: Homans sign is negative, no sign of DVT Skin: Skin color, texture, turgor normal. No rashes or lesions Neurologic: Grossly normal Proceduure: speculum placed inside vagina, cerclage visualized, cut and removed intact with 2 sutures.  VE reveals firm 1 cm cervix, consistent with retained suture. No further suture visualized, but firmness cut at 6 o'clock and cervix then 2-3 cm/80/-2/vertex FHR 140's, average variability and +  acels, no decels.  Lab Results  Component Value Date   WBC 8.3 06/25/2016   HGB 9.8 (L) 06/25/2016   HCT 30.7 (L) 06/25/2016   MCV 77.1 (L) 06/25/2016   PLT 266 06/25/2016         ABO, Rh: O/Positive/-- (02/27 1414)  Antibody: Negative (02/27 1414)  Rubella: !Error!  RPR: Non Reactive (02/27 1414)  HBsAg: Negative (02/27 1414)  HIV: Non-reactive (01/05 0000)  GBS: Negative (05/08 1046)    Prenatal Transfer Tool  Maternal Diabetes: No Genetic Screening: Declined Maternal Ultrasounds/Referrals: Abnormal:  Findings:   Other: Fetal Ultrasounds or other Referrals:  None Maternal Substance Abuse:  No Significant Maternal Medications:  None Significant Maternal Lab Results: Lab values include: Group B Strep negative   Assessment/Plan Patient Active Problem List   Diagnosis Date Noted  . 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 Short cervix affecting pregnancy 04/13/2016  . Supervision of high-risk pregnancy 04/13/2016  . Language barrier 04/13/2016   For IOL, ripe cervix, may start pitocin.  Reva Boresanya S Kynnadi Dicenso 06/25/2016, 9:21 AM

## 2016-06-26 NOTE — Lactation Note (Signed)
This note was copied from a baby's chart. Lactation Consultation Note  Patient Name: Danielle Lovenia ShuckBhaktiben Kiranbhai Slyter BJYNW'GToday's Date: 06/26/2016 Reason for consult: Initial assessment  Initial visit at 22 hours of age.  Mom speaks some english and has family at bedside supportive and interpreting.   Mom reports baby is being breast fed and supplemented with formula and EBM given by spoon.  Parents report comfort with spoon feeding supplement, although LC discussed other options for feeding including syringe and bottle as needed.  Baby asleep in crib after recent feeding. LC reported to RN, Lyla Sonarrie feeding plan and need for follow up.   LC provided parents with green communication sheet for late preterm babies, as this LC feels baby needs to be following LPT feeding policy.  Mom has pumped once yesterday and twice today.  LC encouraged mom to pump and or hand express 6-8x/24 hours to increase supply and demand. Parents aware to wake baby for feedings every 2 1/2-3 hours, breast feed and then supplement with EBM or formula about 10mls at this time.  Feeding guidelines for age given and discussed, but will need review.   LC offered to observe pumping at this time, mom declines and plans to pump after visitors leave. [facility] given and discussed.  Encouraged to feed with early cues on demand.  Early newborn behavior discussed.  Hand expression reported by mom with colostrum visible.  Mom to call for assist as needed.      Maternal Data Has patient been taught Hand Expression?: Yes Does the patient have breastfeeding experience prior to this delivery?: No  Feeding Feeding Type: Breast Fed Length of feed: 5 min  LATCH Score/Interventions Latch: Repeated attempts needed to sustain latch, nipple held in mouth throughout feeding, stimulation needed to elicit sucking reflex. Intervention(s): Skin to skin;Waking techniques  Audible Swallowing: A few with stimulation Intervention(s): Hand  expression;Skin to skin  Type of Nipple: Everted at rest and after stimulation Intervention(s): Double electric pump  Comfort (Breast/Nipple): Soft / non-tender     Hold (Positioning): Assistance needed to correctly position infant at breast and maintain latch. Intervention(s): Breastfeeding basics reviewed;Skin to skin  LATCH Score: 7  Lactation Tools Discussed/Used WIC Program: No (may call) Pump Review: Milk Storage Date initiated:: 06/26/16   Consult Status Consult Status: Follow-up Date: 06/27/16 Follow-up type: In-patient    Jannifer RodneyShoptaw, Sammantha Mehlhaff Lynn 06/26/2016, 5:04 PM

## 2016-06-26 NOTE — Progress Notes (Signed)
Mom speaks "Gujaratio". Aunt is patient's interpreter. Aunt signed form.

## 2016-06-26 NOTE — Progress Notes (Signed)
Patient ID: Lovenia ShuckBhaktiben Kiranbhai Andrepont, female   DOB: February 18, 1990, 26 y.o.   MRN: [id number]  POSTPARTUM PROGRESS NOTE  Post Partum Day #1 Subjective:  Bisma Melanee SpryKiranbhai Leikam is a 26 y.o. G1P1001 4451w0d s/p NSVD.  No acute events overnight.  Pt denies problems with ambulating, voiding or po intake.  She denies nausea or vomiting.  Pain is well controlled.  She has had flatus. She has not had bowel movement.  Lochia Small, no clots.   Objective: Blood pressure 122/74, pulse 75, temperature 97.8 F (36.6 C), temperature source Oral, resp. rate 18, height 5' (1.524 m), weight 145 lb (65.8 kg), last menstrual period 09/28/2015, SpO2 97 %, unknown if currently breastfeeding.  Physical Exam:  General: alert, cooperative and no distress Lochia:normal flow Chest: CTAB Heart: RRR no m/r/g Abdomen: +BS, soft, nontender,  Uterine Fundus: firm, below umbilicus DVT Evaluation: No calf swelling or tenderness Extremities: No edema   Recent Labs  06/25/16 0840  HGB 9.8*  HCT 30.7*    Assessment/Plan:  ASSESSMENT: Maddox Melanee SpryKiranbhai Stupka is a 26 y.o. G1P1001 4251w0d s/p NSVD  Plan for discharge tomorrow, Breastfeeding, Lactation consult and Contraception was discussed with patient at length, will send home with education. She currently does not want any birth control, is planning on condoms, but seemed interested in LARC.   LOS: 1 day   Jen MowElizabeth Taeshawn Helfman, DO OB Fellow Center [facility], [facility]  06/26/2016, 9:19 AM

## 2016-06-26 NOTE — Plan of Care (Signed)
Problem: Coping: Goal: Ability to identify and utilize available resources and services will improve Continue to support breastfeeding a late preterm infant.  DEBP instructions given

## 2016-06-27 MED ORDER — IBUPROFEN 600 MG PO TABS: 600.0000 mg | ORAL_TABLET | Freq: Four times a day (QID) | ORAL | 0 refills | Status: DC

## 2016-06-27 NOTE — Lactation Note (Signed)
This note was copied from a baby's chart. Lactation Consultation Note  Patient Name: Danielle Monroe ZOXWR'UToday's Date: 06/27/2016  Mom states baby is nursing well on both breasts.  She is pumping transitional milk and giving it back to baby.  Questions answered.  Discharge instructions given including engorgement treatment.  Reviewed outpatient lactation services and encouraged to call prn.   Maternal Data    Feeding Feeding Type: Breast Milk Length of feed: 10 min  LATCH Score/Interventions                      Lactation Tools Discussed/Used     Consult Status      Huston FoleyMOULDEN, [name] 06/27/2016, 9:28 AM

## 2016-06-27 NOTE — Discharge Instructions (Signed)
Home Care Instructions for Mom ° ACTIVITY °· Gradually return to your regular activities. °· Let yourself rest. Nap while your baby sleeps. °· Avoid lifting anything that is heavier than 10 lb (4.5 kg) until your health care provider says it is okay. °· Avoid activities that take a lot of effort and energy (are strenuous) until approved by your health care provider. Walking at a slow-to-moderate pace is usually safe. °· If you had a cesarean delivery: °¨ Do not vacuum, climb stairs, or drive a car for 4-6 weeks. °¨ Have someone help you at home until you feel like you can do your usual activities yourself. °¨ Do exercises as told by your health care provider, if this applies. °VAGINAL BLEEDING °You may continue to bleed for 4-6 weeks after delivery. Over time, the amount of blood usually decreases and the color of the blood usually gets lighter. However, the flow of bright red blood may increase if you have been too active. If you need to use more than one pad in an hour because your pad gets soaked, or if you pass a large clot: °· Lie down. °· Raise your feet. °· Place a cold compress on your lower abdomen. °· Rest. °· Call your health care provider. °If you are breastfeeding, your period should return anytime between 8 weeks after delivery and the time that you stop breastfeeding. If you are not breastfeeding, your period should return 6-8 weeks after delivery. °PERINEAL CARE °The perineal area, or perineum, is the part of your body between your thighs. After delivery, this area needs special care. Follow these instructions as told by your health care provider. °· Take warm tub baths for 15-20 minutes. °· Use medicated pads and pain-relieving sprays and creams as told. °· Do not use tampons or douches until vaginal bleeding has stopped. °· Each time you go to the bathroom: °¨ Use a peri bottle. °¨ Change your pad. °¨ Use towelettes in place of toilet paper until your stitches have healed. °· Do Kegel exercises  every day. Kegel exercises help to maintain the muscles that support the vagina, bladder, and bowels. You can do these exercises while you are standing, sitting, or lying down. To do Kegel exercises: °¨ Tighten the muscles of your abdomen and the muscles that surround your birth canal. °¨ Hold for a few seconds. °¨ Relax. °¨ Repeat until you have done this 5 times in a row. °· To prevent hemorrhoids from developing or getting worse: °¨ Drink enough fluid to keep your urine clear or pale yellow. °¨ Avoid straining when having a bowel movement. °¨ Take over-the-counter medicines and stool softeners as told by your health care provider. °BREAST CARE °· Wear a tight-fitting bra. °· Avoid taking over-the-counter pain medicine for breast discomfort. °· Apply ice to the breasts to help with discomfort as needed: °¨ Put ice in a plastic bag. °¨ Place a towel between your skin and the bag. °¨ Leave the ice on for 20 minutes or as told by your health care provider. °NUTRITION °· Eat a well-balanced diet. °· Do not try to lose weight quickly by cutting back on calories. °· Take your prenatal vitamins until your postpartum checkup or until your health care provider tells you to stop. °POSTPARTUM DEPRESSION °You may find yourself crying for no apparent reason and unable to cope with all of the changes that come with having a newborn. This mood is called postpartum depression. Postpartum depression happens because your hormone levels change after delivery.   If you have postpartum depression, get support from your partner, friends, and family. If the depression does not go away on its own after several weeks, contact your health care provider. °BREAST SELF-EXAM °Do a breast self-exam each month, at the same time of the month. If you are breastfeeding, check your breasts just after a feeding, when your breasts are less full. If you are breastfeeding and your period has started, check your breasts on day 5, 6, or 7 of your  period. °Report any lumps, bumps, or discharge to your health care provider. Know that breasts are normally lumpy if you are breastfeeding. This is temporary, and it is not a health risk. °INTIMACY AND SEXUALITY °Avoid sexual activity for at least 3-4 weeks after delivery or until the brownish-red vaginal flow is completely gone. If you want to avoid pregnancy, use some form of birth control. You can get pregnant after delivery, even if you have not had your period. °SEEK MEDICAL CARE IF: °· You feel unable to cope with the changes that a child brings to your life, and these feelings do not go away after several weeks. °· You notice a lump, a bump, or discharge on your breast. °SEEK IMMEDIATE MEDICAL CARE IF: °· Blood soaks your pad in 1 hour or less. °· You have: °¨ Severe pain or cramping in your lower abdomen. °¨ A bad-smelling vaginal discharge. °¨ A fever that is not controlled by medicine. °¨ A fever, and an area of your breast is red and sore. °¨ Pain or redness in your calf. °¨ Sudden, severe chest pain. °¨ Shortness of breath. °¨ Painful or bloody urination. °¨ Problems with your vision. °· You vomit for 12 hours or longer. °· You develop a severe headache. °· You have serious thoughts about hurting yourself, your child, or anyone else. °This information is not intended to replace advice given to you by your health care provider. Make sure you discuss any questions you have with your health care provider. °Document Released: 01/30/2000 Document Revised: 07/10/2015 Document Reviewed: 08/05/2014 °Elsevier Interactive Patient Education © 2017 Elsevier Inc[employer] °Contraception Choices °Contraception, also called birth control, means things to use or ways to try not to get pregnant. °Hormonal birth control ° This kind of birth control uses hormones. Here are some types of hormonal birth control: °· A tube that is put under skin of the arm (implant). The tube can stay in for as long as 3 years. °· Shots to get every 3  months (injections). °· Pills to take every day (birth control pills). °· A patch to change 1 time each week for 3 weeks (birth control patch). After that, the patch is taken off for 1 week. °· A ring to put in the vagina. The ring is left in for 3 weeks. Then it is taken out of the vagina for 1 week. Then a new ring is put in. °· Pills to take after unprotected sex (emergency birth control pills). °Barrier birth control ° Here are some types of barrier birth control: °· A thin covering that is put on the penis before sex (female condom). The covering is thrown away after sex. °· A soft, loose covering that is put in the vagina before sex (female condom). The covering is thrown away after sex. °· A rubber bowl that sits over the cervix (diaphragm). The bowl must be made for you. The bowl is put into the vagina before sex. The bowl is left in for 6-8 hours after sex. It   is taken out within 24 hours. °· A small, soft cup that fits over the cervix (cervical cap). The cup must be made for you. The cup can be left in for 6-8 hours after sex. It is taken out within 48 hours. °· A sponge that is put into the vagina before sex. It must be left in for at least 6 hours after sex. It must be taken out within 30 hours. Then it is thrown away. °· A chemical that kills or stops sperm from getting into the uterus (spermicide). It may be a pill, cream, jelly, or foam to put in the vagina. The chemical should be used at least 10-15 minutes before sex. °IUD (intrauterine) birth control °An IUD is a small, T-shaped piece of plastic. It is put inside the uterus. There are two kinds: °· Hormone IUD. This kind can stay in for 3-5 years. °· Copper IUD. This kind can stay in for 10 years. °Permanent birth control °Here are some types of permanent birth control: °· Surgery to block the fallopian tubes. °· Having an insert put into each fallopian tube. °· Surgery to tie off the tubes that carry sperm (vasectomy). °Natural planning birth  control °Here are some types of natural planning birth control: °· Not having sex on the days the woman could get pregnant. °· Using a calendar: °¨ To keep track of the length of each period. °¨ To find out what days pregnancy can happen. °¨ To plan to not have sex on days when pregnancy can happen. °· Watching for symptoms of ovulation and not having sex during ovulation. One way the woman can check for ovulation is to check her temperature. °· Waiting to have sex until after ovulation. °Summary °· Contraception, also called birth control, means things to use or ways to try not to get pregnant. °· Hormonal methods of birth control include implants, injections, pills, patches, vaginal rings, and emergency birth control pills. °· Barrier methods of birth control can include female condoms, female condoms, diaphragms, cervical caps, sponges, and spermicides. °· There are two types of IUD (intrauterine device) birth control. An IUD can be put in a woman's uterus to prevent pregnancy for 3-5 years. °· Permanent sterilization can be done through a procedure for males, females, or both. °· Natural planning methods involve not having sex on the days when the woman could get pregnant. °This information is not intended to replace advice given to you by your health care provider. Make sure you discuss any questions you have with your health care provider. °Document Released: 11/29/2008 Document Revised: 02/12/2016 Document Reviewed: 02/12/2016 °Elsevier Interactive Patient Education © 2017 Elsevier Inc. ° °

## 2016-06-27 NOTE — Progress Notes (Signed)
CSW met with MOB at bedside to discuss car seat needs. At this time, MOB notes she is a medicaid recipient and was wondering if she qualifies for a free car seat. This writer informed MOB that if she is in need of a car seat and a medicaid recipient we can provide a car seat for $30; however, it would not be free. MOB verbalized understanding noting she no longer needs one at this time.   Danielle Monroe, MSW, LCSW-A Clinical Social Worker   [facility]  Office: [phone]  

## 2016-06-27 NOTE — Discharge Summary (Signed)
OB Discharge Summary     Patient Name: Danielle Monroe DOB: [birth year] MRN: [id number]  Date of admission: 06/25/2016 Delivering MD: Cam HaiSHAW, KIMBERLY D   Date of discharge: 06/27/2016  Admitting diagnosis: INDUCTION Intrauterine pregnancy: 3069w0d     Secondary diagnosis:  Active Problems:   Umbilical vein varix affecting pregnancy  Aunt used for interpretation per patient request.  Paper were signed.  Patient appears to understand some [place].  Additional problems: none     Discharge diagnosis: Term Pregnancy Delivered                                                                                                Post partum procedures:none  Augmentation: Pitocin and Removal of cerclage  Complications: None  Hospital course:  Induction of Labor With Vaginal Delivery   26 y.o. yo G1P1001 at 6069w0d was admitted to the hospital 06/25/2016 for induction of labor.  Indication for induction: Favorable cervix at term and umbilical vein varix.  Patient had an uncomplicated labor course as follows: Membrane Rupture Time/Date: 3:08 PM ,06/25/2016   Intrapartum Procedures: Episiotomy: None [1]                                         Lacerations:  2nd degree [3];Perineal [11]  Patient had delivery of a Viable infant.  Information for the patient's newborn:  Max Fickleorania, Girl Hildred LaserBhaktiben Kiranbhai [[id number]][[id number]]  Delivery Method: Vag-Spont   06/25/2016  Details of delivery can be found in separate delivery note.  Patient had a routine postpartum course. Patient is discharged home 06/27/16.  Physical exam  Vitals:   06/26/16 0158 06/26/16 0437 06/26/16 1824 06/27/16 0500  BP: 112/82 122/74 127/87 123/74  Pulse: 82 75 87 80  Resp: 18 18 18 18   Temp: 98.6 F (37 C) 97.8 F (36.6 C) 98.2 F (36.8 C) 98 F (36.7 C)  TempSrc: Oral Oral Oral Oral  SpO2:      Weight:      Height:       General: alert, cooperative and no distress Lochia: appropriate Uterine Fundus: firm Incision:  Healing well with no significant drainage DVT Evaluation: No evidence of DVT seen on physical exam. Labs: Lab Results  Component Value Date   WBC 8.3 06/25/2016   HGB 9.8 (L) 06/25/2016   HCT 30.7 (L) 06/25/2016   MCV 77.1 (L) 06/25/2016   PLT 266 06/25/2016   No flowsheet data found.  Discharge instruction: per After Visit Summary and "Baby and Me Booklet".  After visit meds:  Allergies as of 06/27/2016   No Known Allergies     Medication List    TAKE these medications   ibuprofen 600 MG tablet Commonly known as:  ADVIL,MOTRIN Take 1 tablet (600 mg total) by mouth every 6 (six) hours.   PRENATAL VITAMINS PO Take 1 tablet by mouth daily.       Diet: routine diet  Activity: Advance as tolerated. Pelvic rest for 6 weeks.   Outpatient follow up:6 weeks Follow  up Appt:Future Appointments Date Time Provider Department Center  08/03/2016 10:00 AM Rasch, Harolyn Rutherford, NP [facility]   Follow up Visit:No Follow-up on file.  Postpartum contraception: Condoms and Undecided  Newborn Data: Live born female  Birth Weight: 5 lb 9.1 oz (2526 g) APGAR: 9, 9  Baby Feeding: Breast Disposition:home with mother   06/27/2016 Wynelle Bourgeois, CNM

## 2016-08-03 ENCOUNTER — Ambulatory Visit (INDEPENDENT_AMBULATORY_CARE_PROVIDER_SITE_OTHER): Payer: Medicaid Other | Admitting: Obstetrics and Gynecology

## 2016-08-03 ENCOUNTER — Encounter: Payer: Self-pay | Admitting: Obstetrics and Gynecology

## 2016-08-03 DIAGNOSIS — O0990 Supervision of high risk pregnancy, unspecified, unspecified trimester: Secondary | ICD-10-CM

## 2016-08-03 DIAGNOSIS — Z1272 Encounter for screening for malignant neoplasm of vagina: Secondary | ICD-10-CM

## 2016-08-03 MED ORDER — PRENATAL VITAMINS 28-0.8 MG PO TABS: 1.0000 | ORAL_TABLET | Freq: Every day | ORAL | 6 refills | Status: DC

## 2016-08-03 NOTE — Progress Notes (Signed)
Subjective:     Danielle Monroe is a 26 y.o. female who presents for a postpartum visit. She is 5 weeks postpartum following a spontaneous vaginal delivery. I have fully reviewed the prenatal and intrapartum course. The delivery was at 37 gestational weeks. Outcome: spontaneous vaginal delivery. Anesthesia: none. Postpartum course has been unremarkable. Baby's course has been unremarkable. Baby is feeding by breast. Bleeding staining only. Bowel function is normal. Bladder function is normal. Patient is not sexually active. Contraception method is none. Postpartum depression screening: negative.  The following portions of the patient's history were reviewed and updated as appropriate: allergies, current medications, past family history, past medical history, past social history, past surgical history and problem list.  Review of Systems Pertinent items are noted in HPI.   Objective:    BP 120/74   Pulse 84   Wt 127 lb 4.8 oz (57.7 kg)   Breastfeeding? Yes   BMI 24.86 kg/m   General:  alert, cooperative and appears stated age   Breasts:  inspection negative, no nipple discharge or bleeding, no masses or nodularity palpable  Lungs: clear to auscultation bilaterally  Heart:  regular rate and rhythm, S1, S2 normal, no murmur, click, rub or gallop  Abdomen: soft, non-tender; bowel sounds normal; no masses,  no organomegaly   Vulva:  normal  Vagina: normal vagina  Cervix:  normal cervix, small amount of mucoid, bloody discharge  Corpus: normal size, contour, position, consistency, mobility, non-tender  Adnexa:  normal adnexa  Rectal Exam: Not examined         Assessment:   Normal postpartum exam. Pap smear done at today's visit.   Plan:   1. Contraception: none 2. Pap smear done today  3. Follow up in: as needed.   4. Refill on prenatal vitamins    Rasch, Harolyn RutherfordJennifer I, NP

## 2016-08-04 ENCOUNTER — Other Ambulatory Visit (HOSPITAL_COMMUNITY)
Admission: RE | Admit: 2016-08-04 | Discharge: 2016-08-04 | Disposition: A | Discharge status: Home / Self Care | Payer: Medicaid Other | Source: Ambulatory Visit | Attending: Obstetrics and Gynecology | Admitting: Obstetrics and Gynecology

## 2016-08-04 DIAGNOSIS — Z1272 Encounter for screening for malignant neoplasm of vagina: Secondary | ICD-10-CM | POA: Insufficient documentation

## 2016-08-04 NOTE — Addendum Note (Signed)
Addended by: Faythe CasaBELLAMY, Arius Harnois M on: 08/04/2016 05:15 PM   Modules accepted: Orders

## 2016-08-05 LAB — CYTOLOGY - PAP: Diagnosis: NEGATIVE

## 2017-07-12 ENCOUNTER — Ambulatory Visit (INDEPENDENT_AMBULATORY_CARE_PROVIDER_SITE_OTHER): Payer: Medicaid Other | Admitting: Obstetrics and Gynecology

## 2017-07-12 ENCOUNTER — Encounter: Payer: Self-pay | Admitting: Obstetrics and Gynecology

## 2017-07-12 VITALS — BP 122/82 | HR 90 | Wt 124.1 lb

## 2017-07-12 DIAGNOSIS — R531 Weakness: Secondary | ICD-10-CM

## 2017-07-12 DIAGNOSIS — Z309 Encounter for contraceptive management, unspecified: Secondary | ICD-10-CM

## 2017-07-12 DIAGNOSIS — Z0000 Encounter for general adult medical examination without abnormal findings: Secondary | ICD-10-CM

## 2017-07-12 DIAGNOSIS — N926 Irregular menstruation, unspecified: Secondary | ICD-10-CM

## 2017-07-12 NOTE — Progress Notes (Signed)
GYNECOLOGY OFFICE VISIT NOTE  History:  27 y.o. G1P1001 here today for annual exam. Patient would also like to discuss her irregular menstraul bleeding. Patient states that ever since she delivered a year ago she has been having irregular bleeding. She will have normal menstraul bleeding for 3 days and then spotting for an additional 2 weeks with only about 1 week without bleeding. Prior to pregnancy periods were regular and normal. Endorses some weakness and fatigue. Has h/o anemia. Patient is still currently breastfeeding infant. She is not on birth control.   She denies any abnormal vaginal discharge, fever, n/v,  pelvic pain or other concerns.    Past Medical History:  Diagnosis Date  . Medical history non-contributory     Past Surgical History:  Procedure Laterality Date  . CERVICAL CERCLAGE  02/2016   No current outpatient medications on file prior to visit.   No current facility-administered medications on file prior to visit.     The following portions of the patient's history were reviewed and updated as appropriate: allergies, current medications, past family history, past medical history, past social history, past surgical history and problem list.   Health Maintenance:  Normal pap and negative HRHPV on 08/04/2016.    Review of Systems:  Pertinent items noted in HPI and remainder of comprehensive ROS otherwise negative.   Objective:  Physical Exam BP 122/82   Pulse 90   Wt 124 lb 1.6 oz (56.3 kg)   BMI 24.24 kg/m  CONSTITUTIONAL: Well-developed, well-nourished female in no acute distress.  HENT:  Normocephalic, atraumatic. External right and left ear normal. Oropharynx is clear and moist EYES: Conjunctivae and EOM are normal. Pupils are equal, round, and reactive to light. No scleral icterus.  NECK: Normal range of motion, supple, no masses SKIN: Skin is warm and dry. No rash noted. Not diaphoretic. No erythema. No pallor. NEUROLOGIC: Alert and oriented to  person, place, and time. Normal muscle tone coordination. No cranial nerve deficit noted. PSYCHIATRIC: Normal mood and affect. Normal behavior. Normal judgment and thought content. CARDIOVASCULAR: Normal heart rate noted, pulses intact.  RESPIRATORY: Effort and breath sounds normal, no problems with respiration noted ABDOMEN: Soft, non-tender, no distention noted.   Breasts: breasts appear normal, no suspicious masses, no skin or nipple changes or axillary nodes. PELVIC: Normal appearing external genitalia; normal appearing vaginal mucosa and cervix.  No abnormal discharge noted.  Normal uterine size, no other palpable masses, no uterine or adnexal tenderness. MUSCULOSKELETAL: Normal range of motion. No edema noted.  Labs and Imaging No results found.  Assessment & Plan:  1. Annual physical exam Discussed preventative health measures. Pap smear done 6 months ago normal. Repeat in 3 years.  Encouraged self breast exams. Encouraged patient to establish with PCP. Handout provided  2. Irregular menstrual bleeding Most likely secondary to her continued breastfeeding. Offered contraception to help regulate better but patient declines at this time. Will reassess irregularity once she has completed breastfeeding.   3. Weakness H/o anemia. With prolonged bleeding with menstruation will check labs. Patient also wanted her vitamin levels checked.   - CBC - Iron, TIBC and Ferritin Panel - B12 and Folate Panel - TSH  Routine preventative health maintenance measures emphasized. Please refer to After Visit Summary for other counseling recommendations.   No follow-ups on file.  Total face-to-face time with patient: 25 minutes. Over 50% of encounter was spent on counseling and coordination of care.  Caryl Ada, DO [facility] Fellow [facility]

## 2017-07-12 NOTE — Patient Instructions (Signed)

## 2017-07-12 NOTE — Progress Notes (Signed)
[employer] #  

## 2017-07-13 LAB — IRON,TIBC AND FERRITIN PANEL
Ferritin: 19 ng/mL (ref 15–150)
Iron Saturation: 15 % (ref 15–55)
Iron: 50 ug/dL (ref 27–159)
Total Iron Binding Capacity: 334 ug/dL (ref 250–450)
UIBC: 284 ug/dL (ref 131–425)

## 2017-07-13 LAB — CBC
Hematocrit: 35.9 % (ref 34.0–46.6)
Hemoglobin: 12 g/dL (ref 11.1–15.9)
MCH: 27.8 pg (ref 26.6–33.0)
MCHC: 33.4 g/dL (ref 31.5–35.7)
MCV: 83 fL (ref 79–97)
Platelets: 291 10*3/uL (ref 150–450)
RBC: 4.31 x10E6/uL (ref 3.77–5.28)
RDW: 14.5 % (ref 12.3–15.4)
WBC: 6.6 10*3/uL (ref 3.4–10.8)

## 2017-07-13 LAB — B12 AND FOLATE PANEL
Folate: 9.3 ng/mL (ref >3.0)
Vitamin B-12: <150 pg/mL — ABNORMAL LOW (ref 232–1245)

## 2017-07-13 LAB — TSH: TSH: 1.89 u[IU]/mL (ref 0.450–4.500)

## 2018-10-25 IMAGING — US US MFM FETAL BPP W/O NON-STRESS
1 series · 14 of 28 positions shown · non-contrast
Comparison: none

[Series 1: us mfm fetal bpp w/o non-stress · 105 acquisitions, 14 frames shown]
[im 4/105]
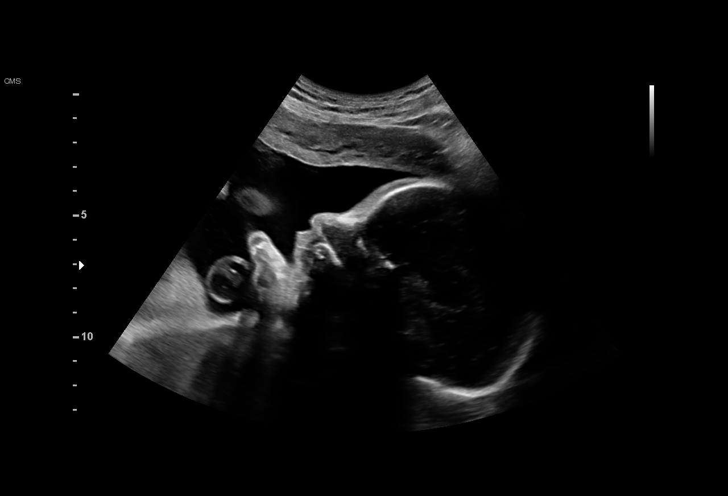
[im 12/105]
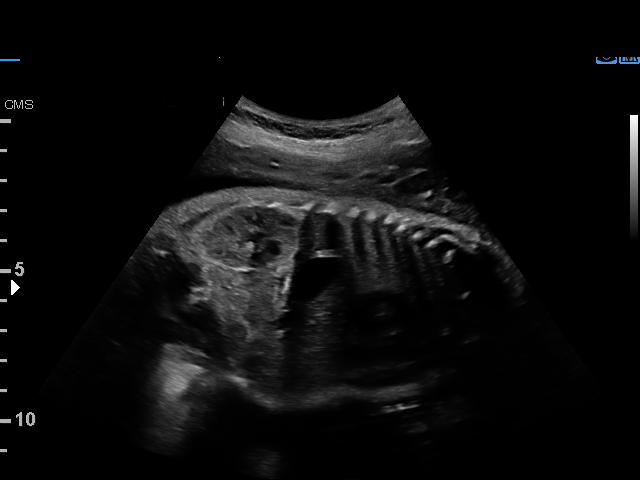
[im 20/105]
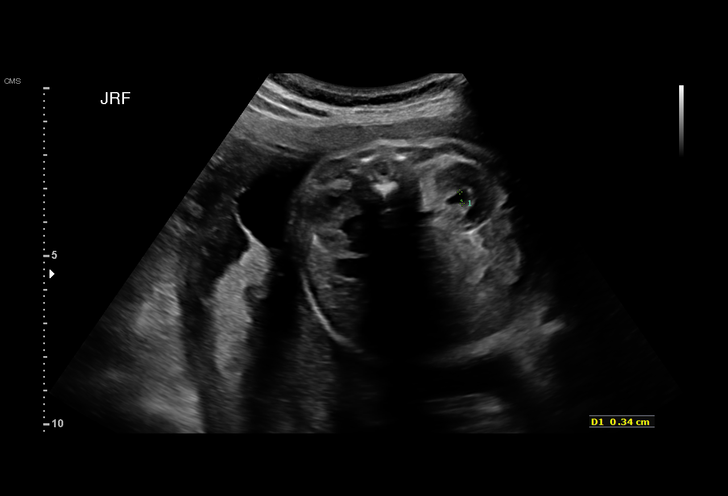
[im 27/105]
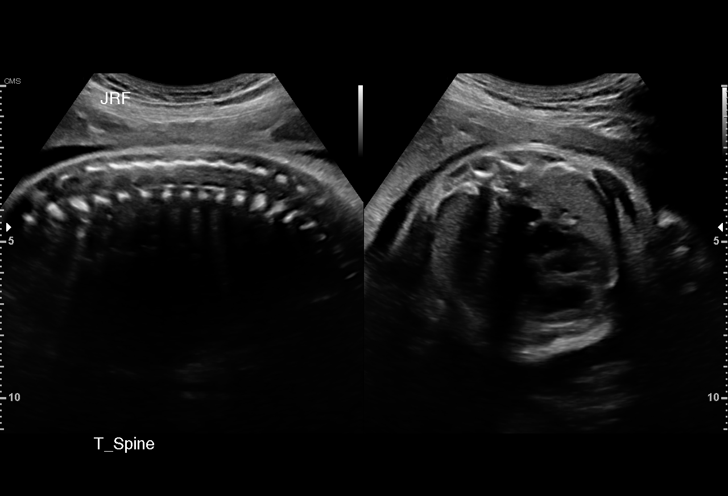
[im 35/105]
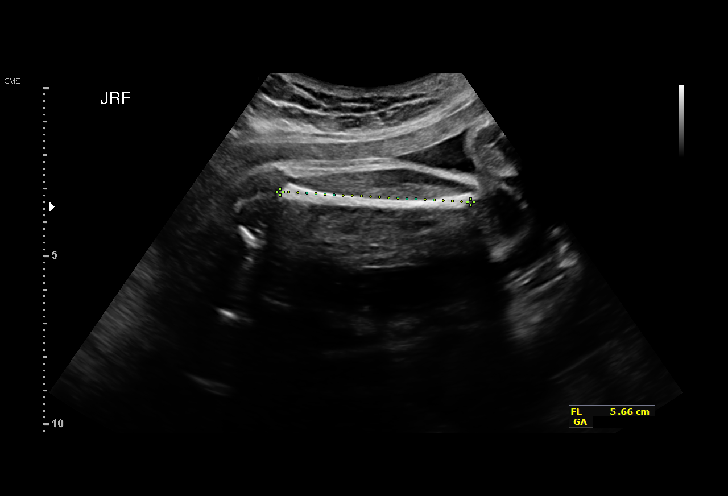
[im 43/105]
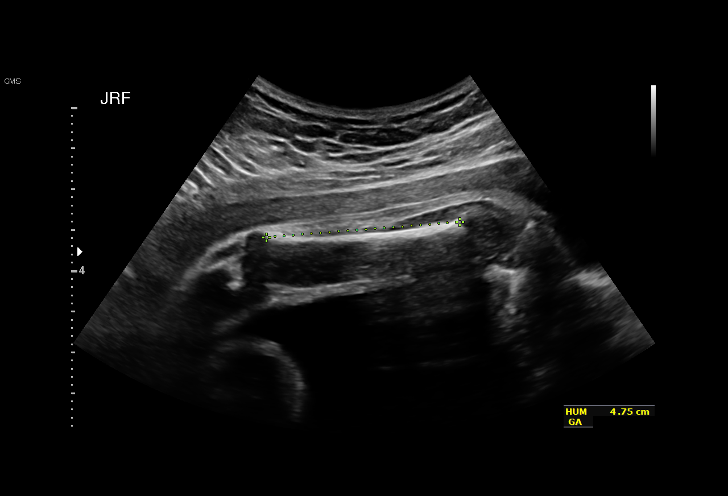
[im 51/105]
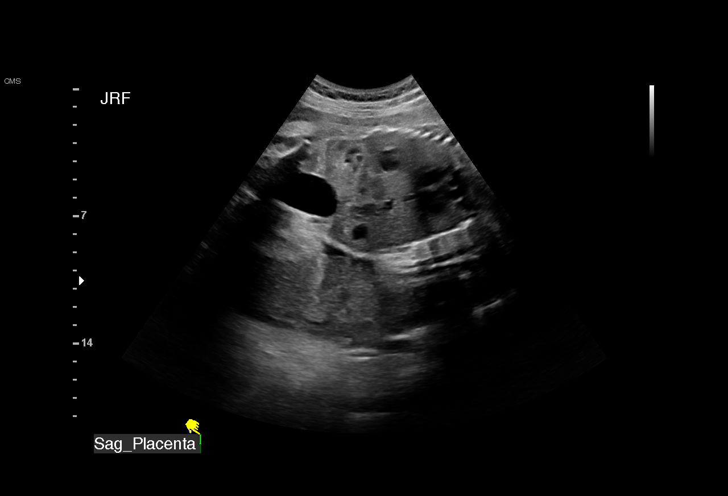
[im 58/105]
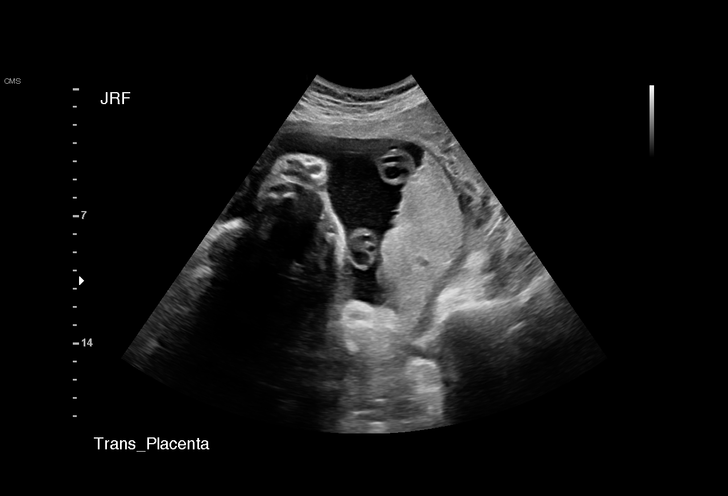
[im 66/105]
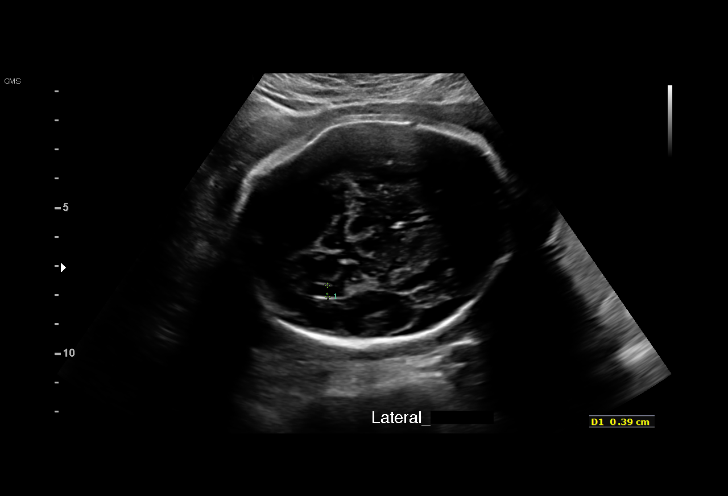
[im 74/105]
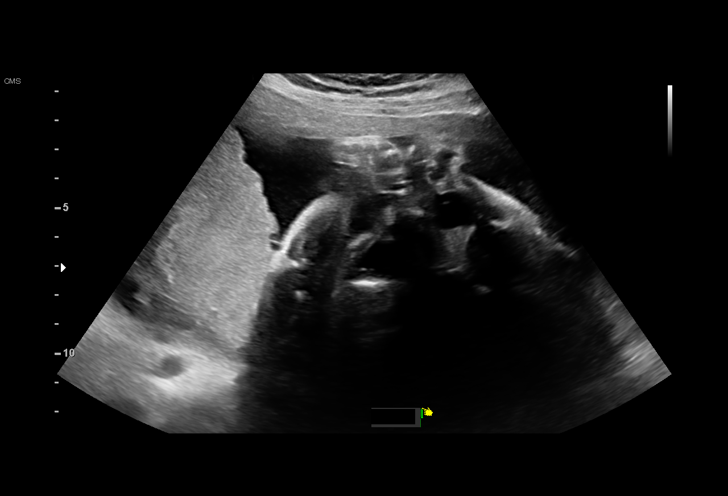
[im 81/105]
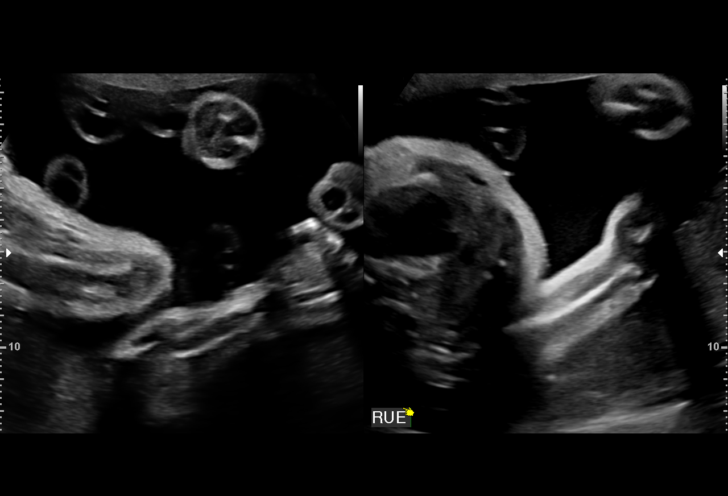
[im 89/105]
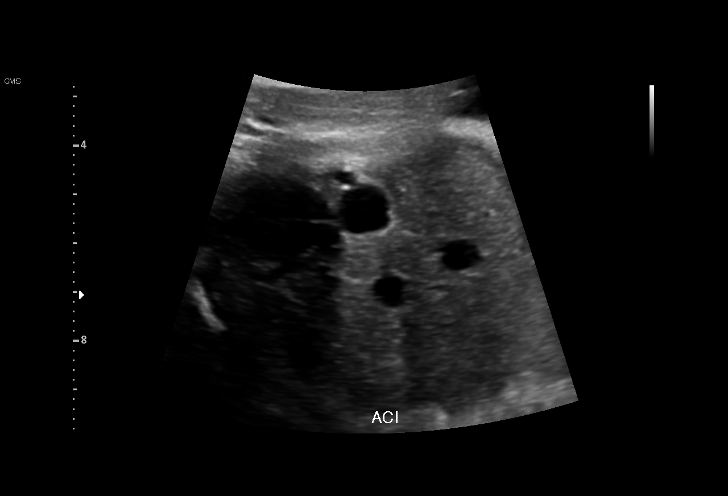
[im 97/105]
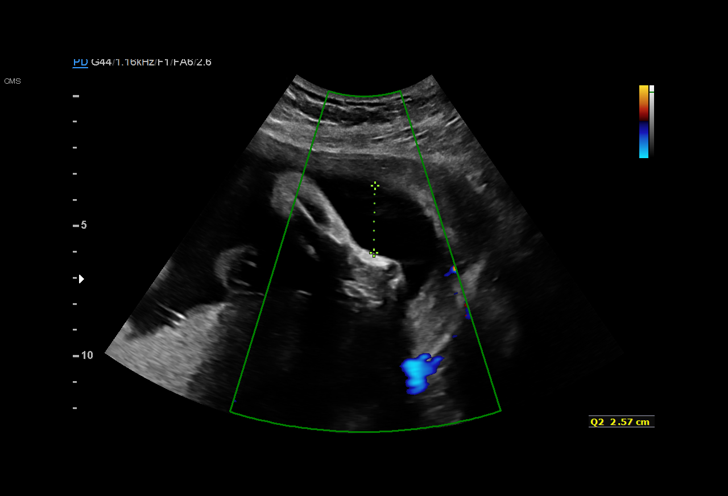
[im 105/105]
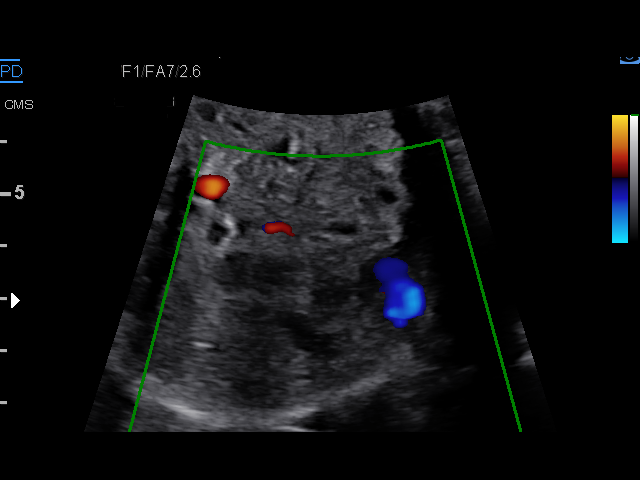

[14 of 28 positions shown; findings below may reference images not displayed]

[facility]

OB/Gyn Clinic

1  BRAUNY POPIENI            [id number]      [id number]     [id number]
2  BRAUNY POPIENI            [id number]      [id number]     [id number]
Indications

30 weeks gestation of pregnancy
Encounter for fetal anatomic survey
Fetal abnormality - other known or
suspected (UVV)
OB History

Gravidity:    1         Term:   0        Prem:   0        SAB:   0
TOP:          0       Ectopic:  0        Living: 0
Fetal Evaluation

Num Of Fetuses:     1
Fetal Heart         146
Rate(bpm):
Cardiac Activity:   Observed
Presentation:       Cephalic
Placenta:           Posterior, above cervical os
P. Cord Insertion:  Visualized, central

Amniotic Fluid
AFI FV:      Subjectively within normal limits

AFI Sum(cm)     %Tile       Largest Pocket(cm)
15.8            57
RUQ(cm)       RLQ(cm)       LUQ(cm)        LLQ(cm)
3.9
Biometry

BPD:      75.4  mm     G. Age:  30w 2d         46  %    CI:        78.59   %    70 - 86
FL/HC:      21.1   %    19.2 -
HC:       269   mm     G. Age:  29w 2d          7  %    HC/AC:      1.07        0.99 -
AC:      250.7  mm     G. Age:  29w 2d         24  %    FL/BPD:     75.3   %    71 - 87
FL:       56.8  mm     G. Age:  29w 6d         30  %    FL/AC:      22.7   %    20 - 24
HUM:      48.6  mm     G. Age:  28w 4d         23  %
CER:      33.3  mm     G. Age:  29w 1d         31  %

CM:        6.6  mm

Est. FW:    4162  gm      3 lb 2 oz     44  %
Gestational Age

LMP:           31w 5d        Date:  09/28/15                 EDD:   07/04/16
U/S Today:     29w 5d                                        EDD:   07/18/16
Best:          30w 0d     Det. By:  Previous Ultrasound      EDD:   07/16/16
(03/02/16)
Anatomy

Cranium:               Appears normal         Aortic Arch:            Appears normal
Cavum:                 Appears normal         Ductal Arch:            Appears normal
Ventricles:            Appears normal         Diaphragm:              Appears normal
Choroid Plexus:        Appears normal         Stomach:                Appears normal, left
sided
Cerebellum:            Appears normal         Abdomen:                Umbilical vein
varix
Posterior Fossa:       Appears normal         Abdominal Wall:         Appears nml (cord
insert, abd wall)
Nuchal Fold:           Not applicable (>20    Cord Vessels:           Appears normal (3
wks GA)                                        vessel cord)
Face:                  Appears normal         Kidneys:                Appear normal
(orbits and profile)
Lips:                  Appears normal         Bladder:                Appears normal
Thoracic:              Appears normal         Spine:                  Appears normal
Heart:                 Appears normal         Upper Extremities:      Appears normal
(4CH, axis, and situs
RVOT:                  Appears normal         Lower Extremities:      Appears normal
LVOT:                  Appears normal

Other:  Parents do not wish to know sex of fetus. 5th digits visualized.
Technically difficult due to advanced gestational age.
Cervix Uterus Adnexa

Cervix
Not visualized (advanced GA >55wks)

Uterus
No abnormality visualized.

Left Ovary
Not visualized.
Right Ovary
Not visualized.

Cul De Sac:   No free fluid seen.

Adnexa:       No abnormality visualized.
Impression

Single living intrauterine pregnancy at 30 weeks 0 days.
Appropriate fetal growth (44%).
Normal amniotic fluid volume.
The fetal anatomic survey is complete.
An umbilical vein varix is seen measuring 1.2 cm in greatest
diameter.
No filling defects or turbulent flow seen within the varix.
Otherwise normal fetal anatomy.
No other fetal anomalies or soft markers of aneuploidy seen.
BPP [DATE].
Recommendations

Recommend weekly BPPs and Doppler assessment of the
umbilical vein varix.

## 2018-10-29 IMAGING — US US MFM FETAL BPP W/O NON-STRESS
1 series · 12 of 28 positions shown · non-contrast
Comparison: none

[Series 1: us mfm fetal bpp w/o non-stress · 31 acquisitions, 12 frames shown]
[im 2/31]
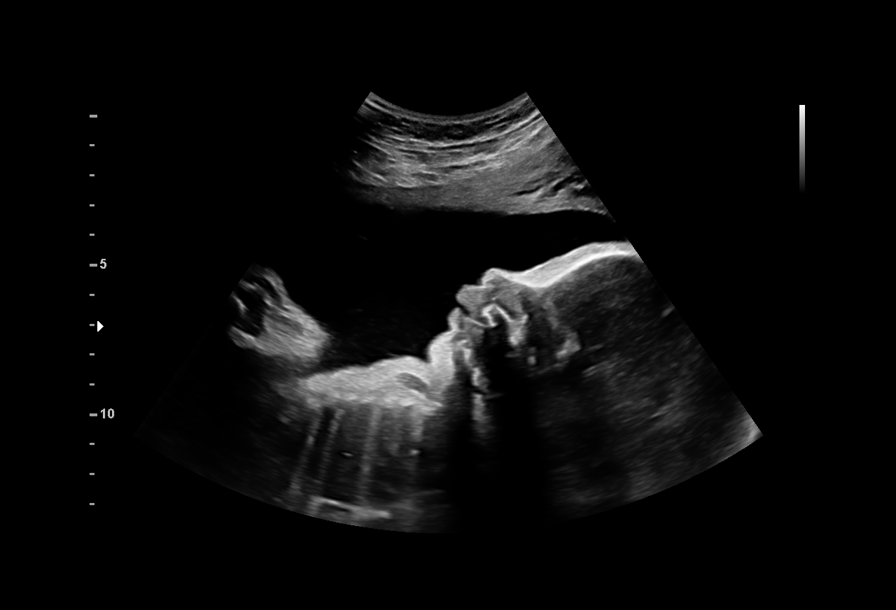
[im 4/31]
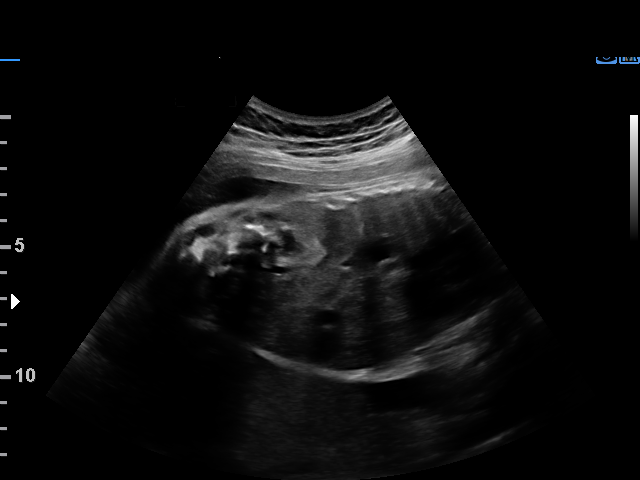
[im 6/31]
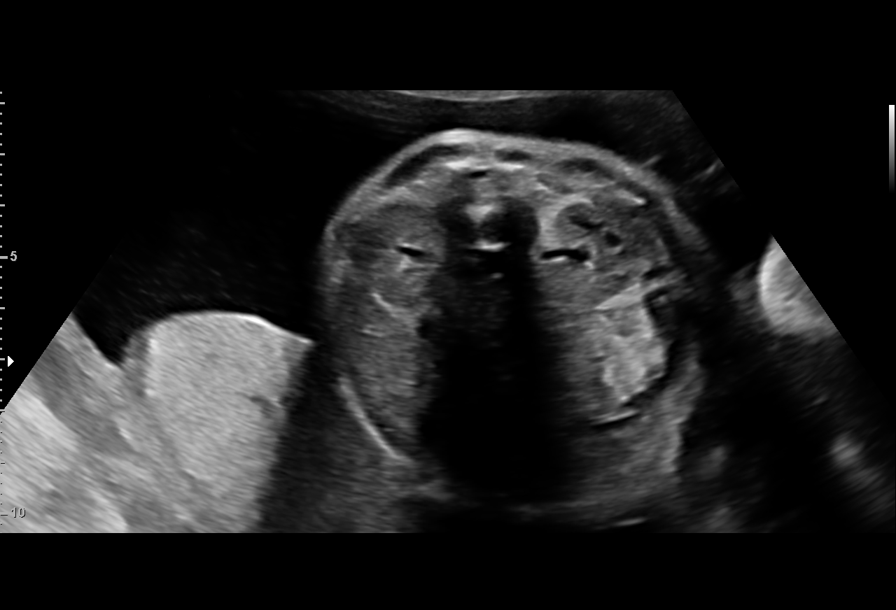
[im 9/31]
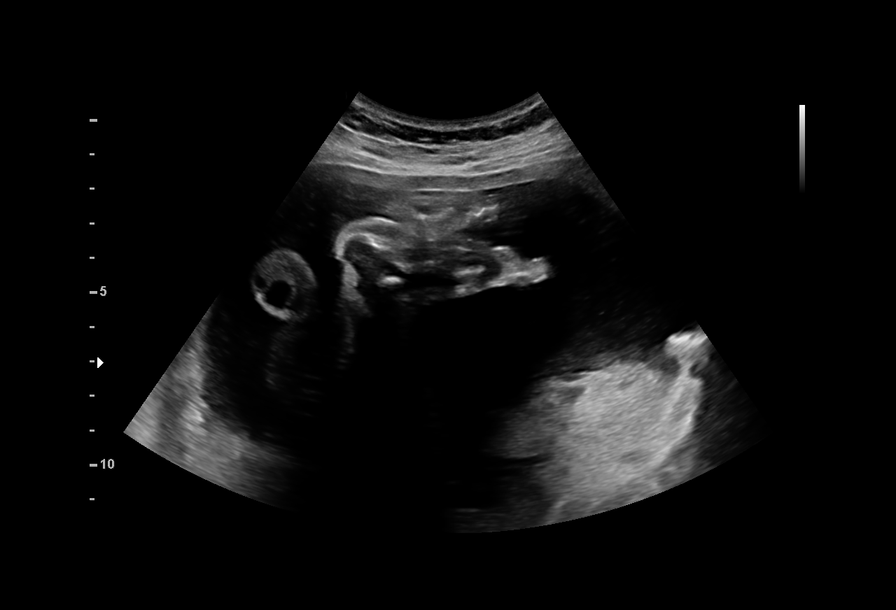
[im 12/31]
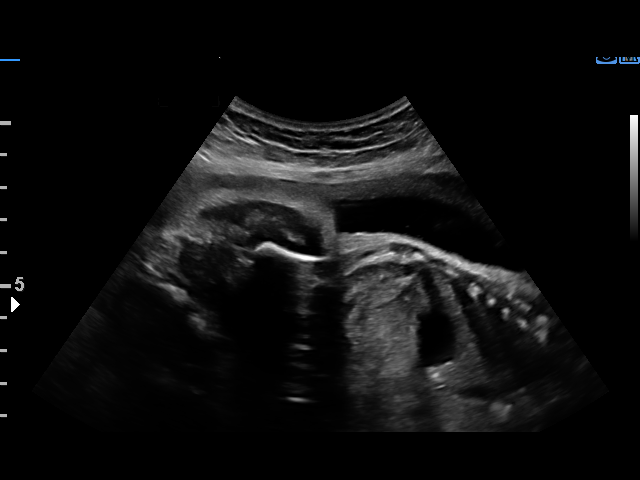
[im 14/31]
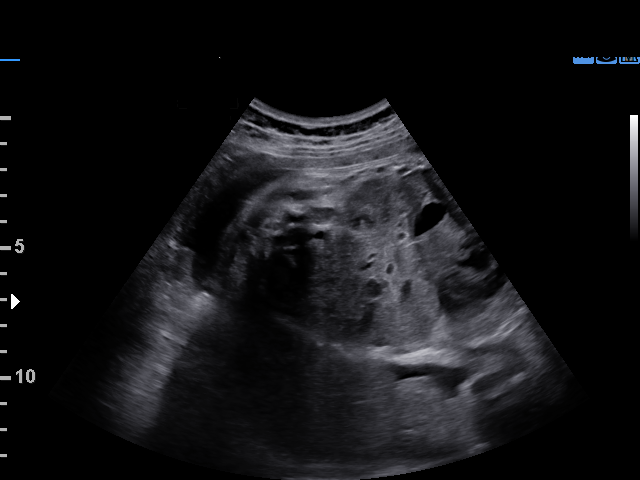
[im 17/31]
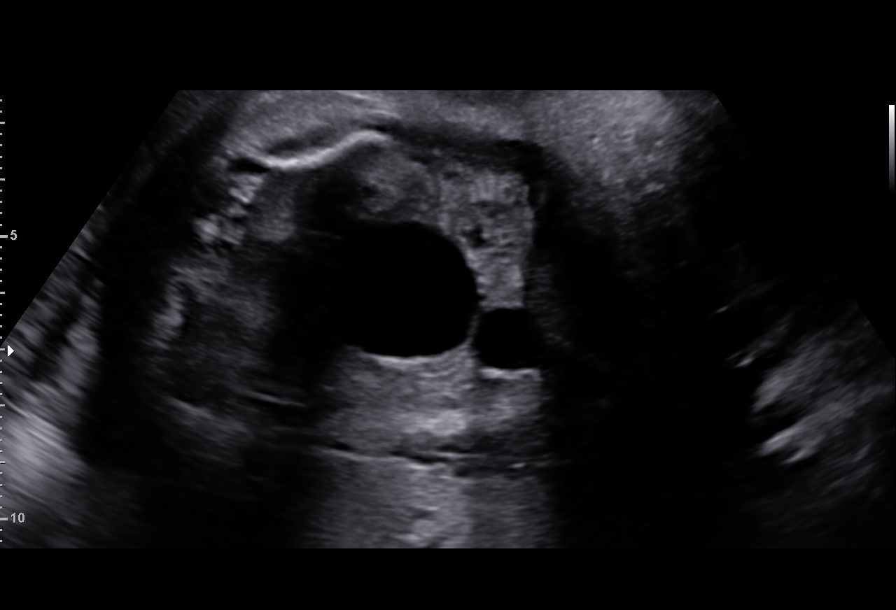
[im 19/31]
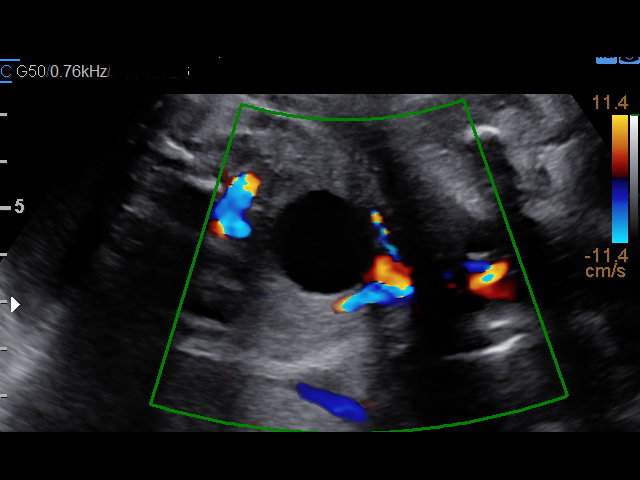
[im 22/31]
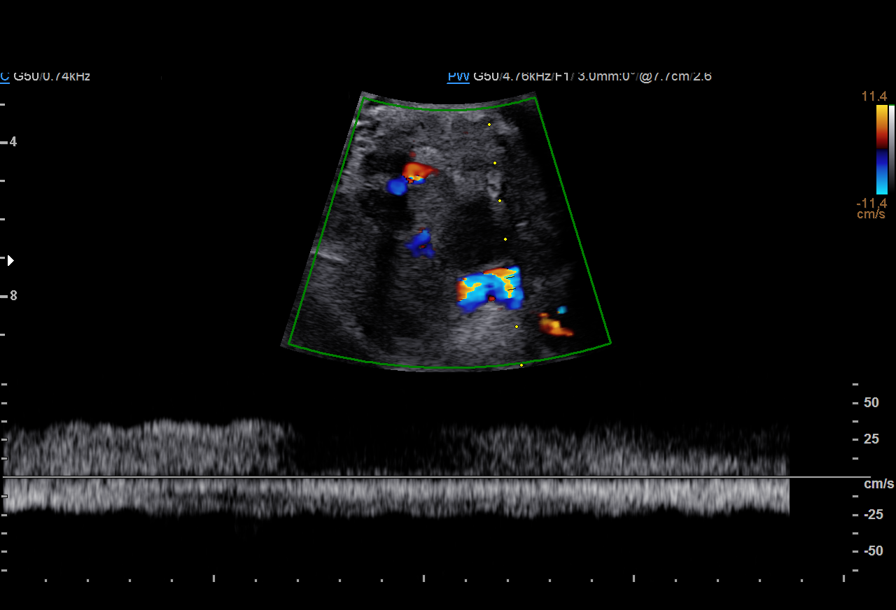
[im 25/31]
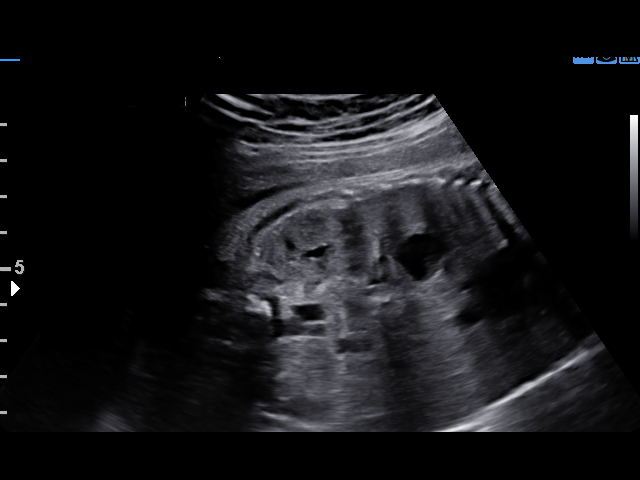
[im 27/31]
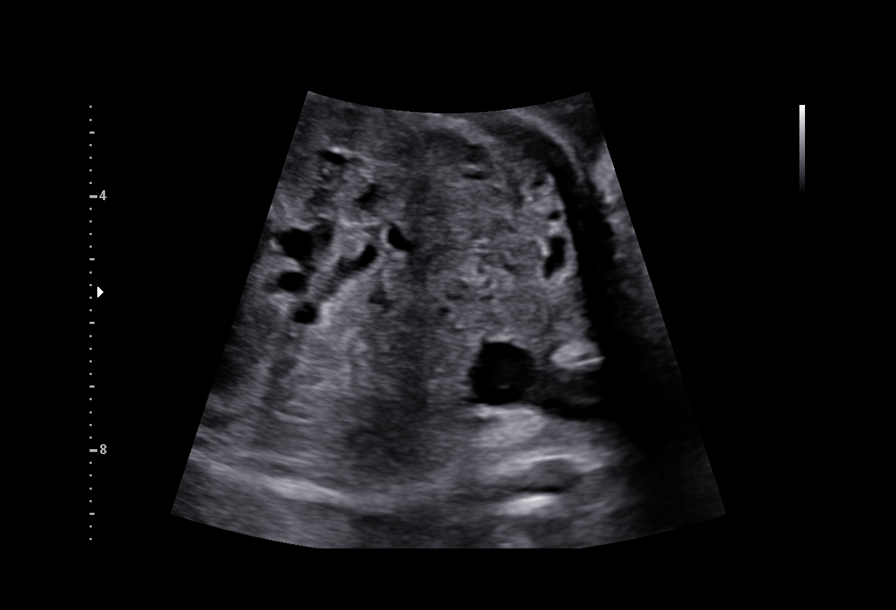
[im 29/31]
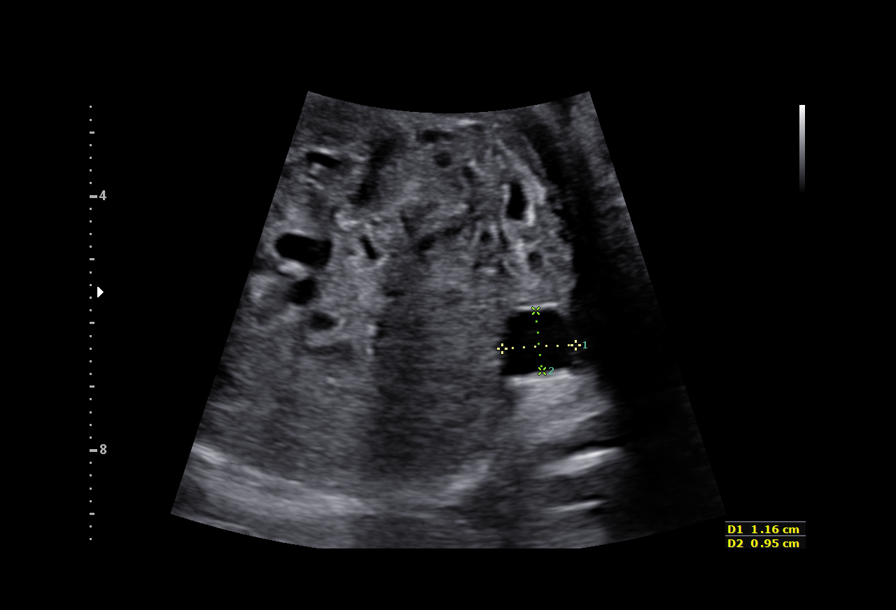

[12 of 28 positions shown; findings below may reference images not displayed]

[facility]

OB/Gyn Clinic

1  KHENT COPINO            [id number]      [id number]     [id number]
Indications

30 weeks gestation of pregnancy
Fetal abnormality - other known or
suspected (UVV)
OB History

Gravidity:    1         Term:   0        Prem:   0        SAB:   0
TOP:          0       Ectopic:  0        Living: 0
Fetal Evaluation

Num Of Fetuses:     1
Fetal Heart         140
Rate(bpm):
Cardiac Activity:   Observed
Presentation:       Cephalic

Amniotic Fluid
AFI FV:      Subjectively within normal limits

AFI Sum(cm)     %Tile       Largest Pocket(cm)
19.03           72

RUQ(cm)       RLQ(cm)       LUQ(cm)        LLQ(cm)
5.28
Biophysical Evaluation

Amniotic F.V:   Within normal limits       F. Tone:        Observed
F. Movement:    Observed                   Score:          [DATE]
F. Breathing:   Observed
Gestational Age

LMP:           32w 2d       Date:   09/28/15                 EDD:   07/04/16
Best:          30w 4d    Det. By:   Previous Ultrasound      EDD:   07/16/16
(03/02/16)
Anatomy

Abdomen:               Umbilical vein
varix 1.2cm
Impression

Single IUP at 30w 4d
Follow up due to umbilical vein varix
BPP [DATE]
The umbilical vein varix measures 1.2 cm (unchanged).  No
filling defects noted
Normal amniotic fluid volume
Recommendations

Continue 2x weekly BPPs, color flow evaluation of UVV
Recommend delivery by 37 weeks gestation

## 2018-11-05 IMAGING — US US MFM FETAL BPP W/O NON-STRESS
1 series · 16 of 28 positions shown · non-contrast
Comparison: none

[Series 1: us mfm fetal bpp w/o non-stress · 28 acquisitions, 16 frames shown]
[im 1/28]
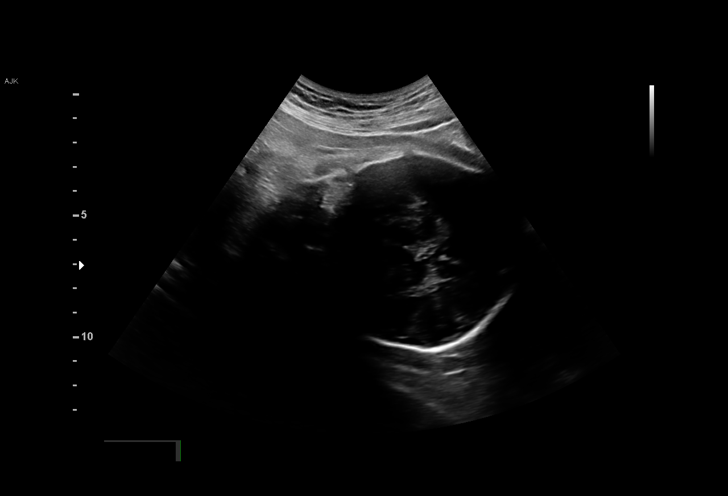
[im 3/28]
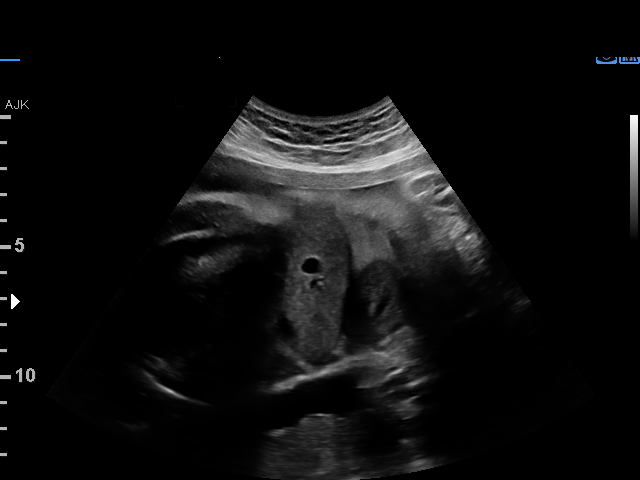
[im 5/28]
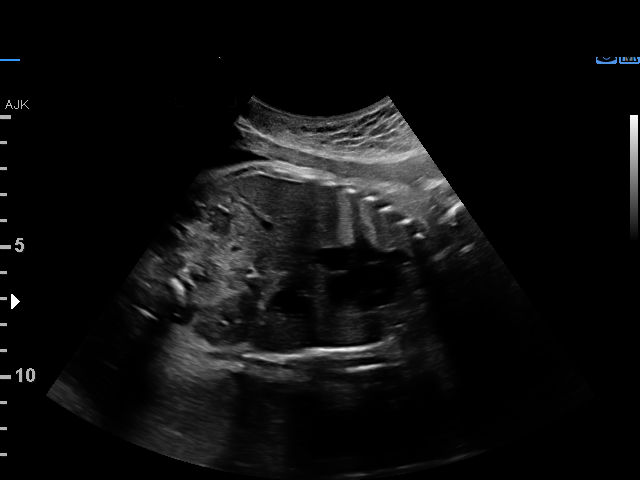
[im 7/28]
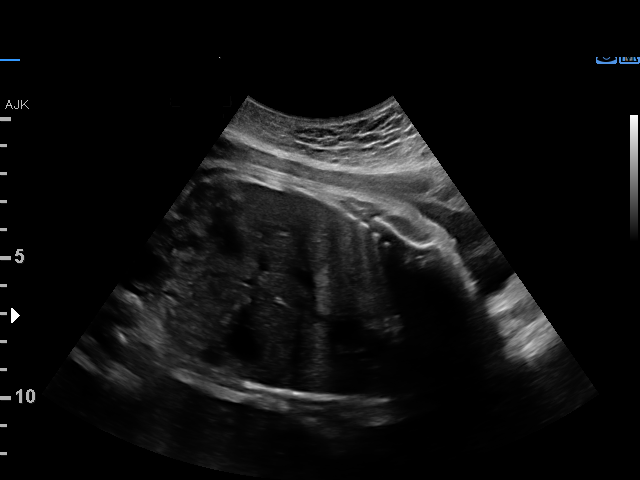
[im 8/28]
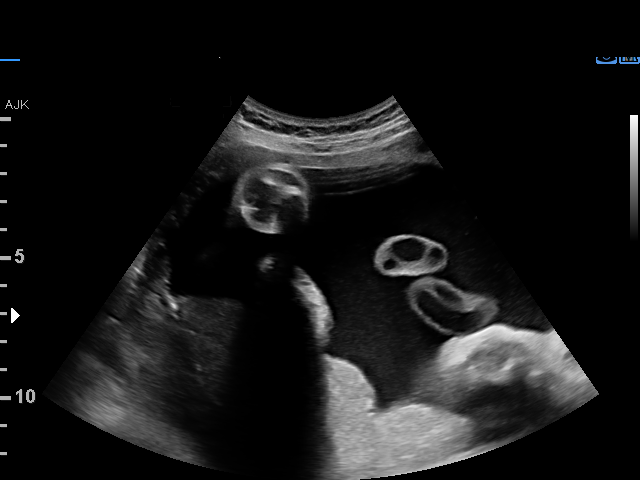
[im 10/28]
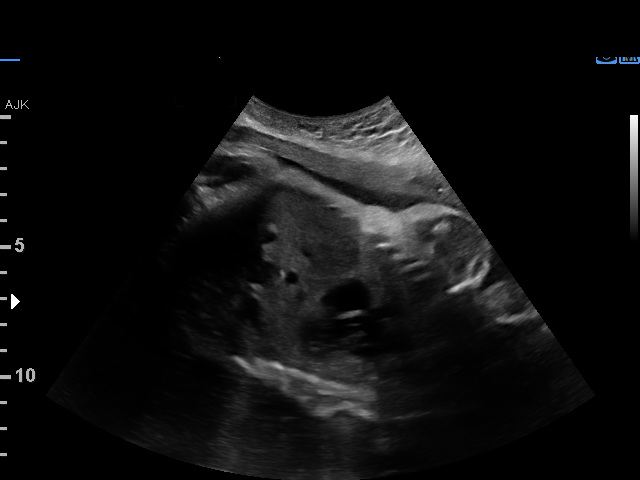
[im 12/28]
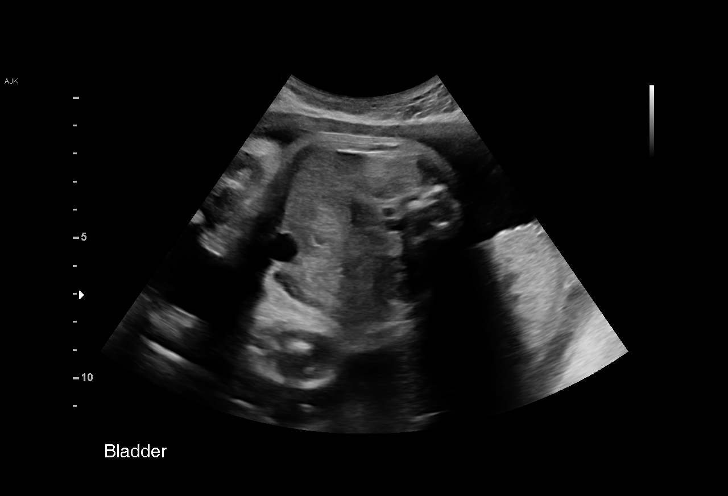
[im 14/28]
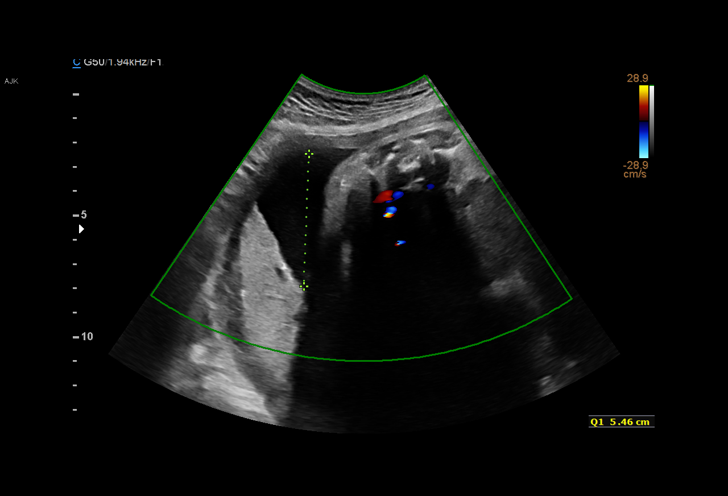
[im 15/28]
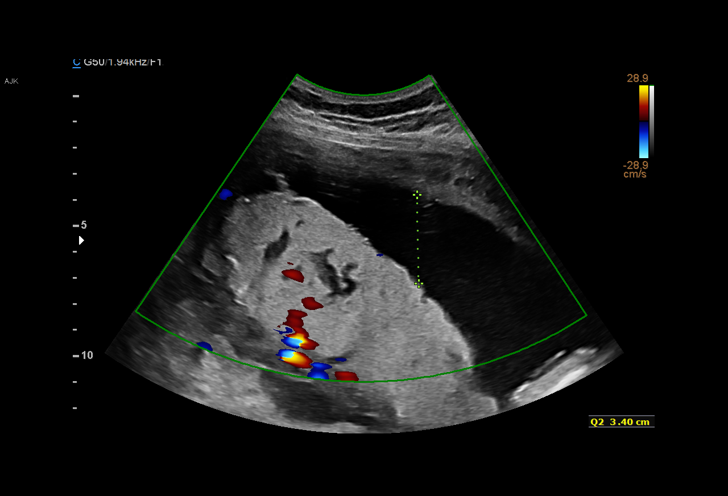
[im 17/28]
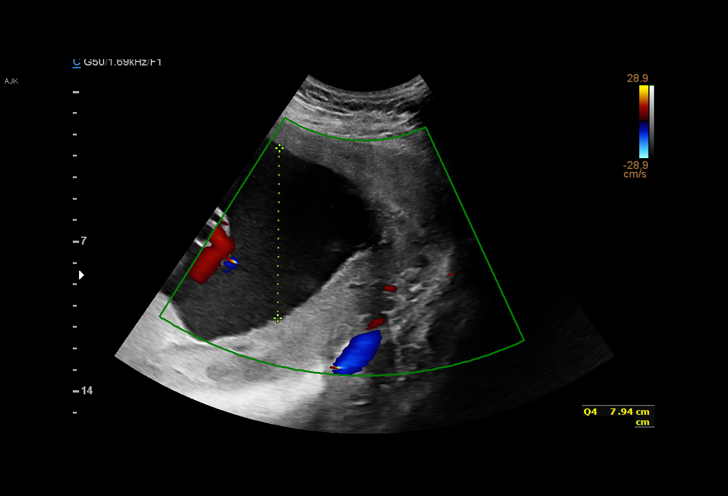
[im 19/28]
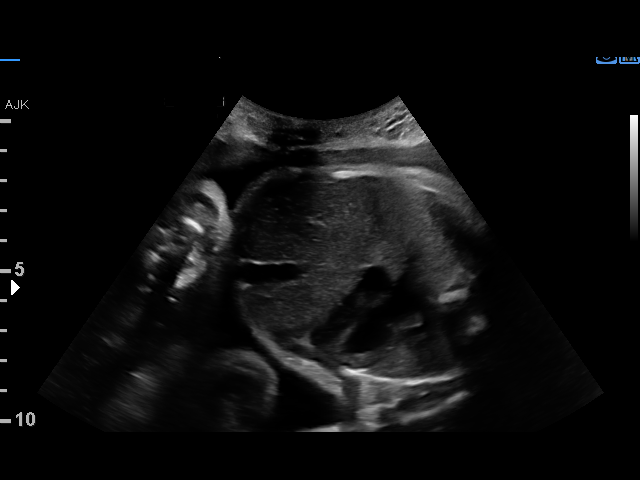
[im 21/28]
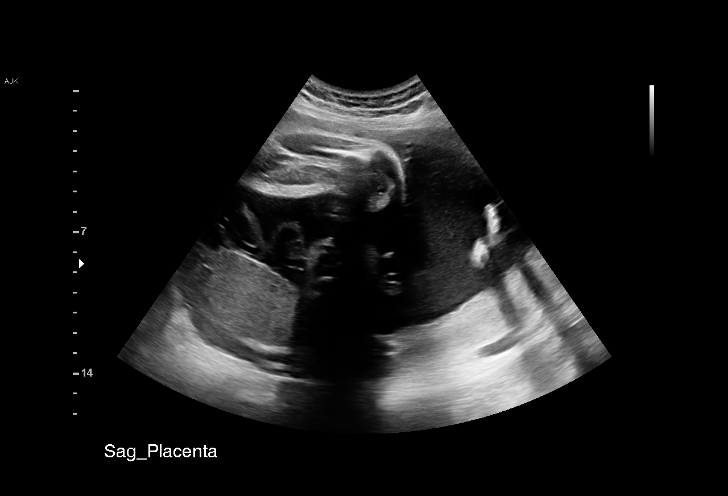
[im 22/28]
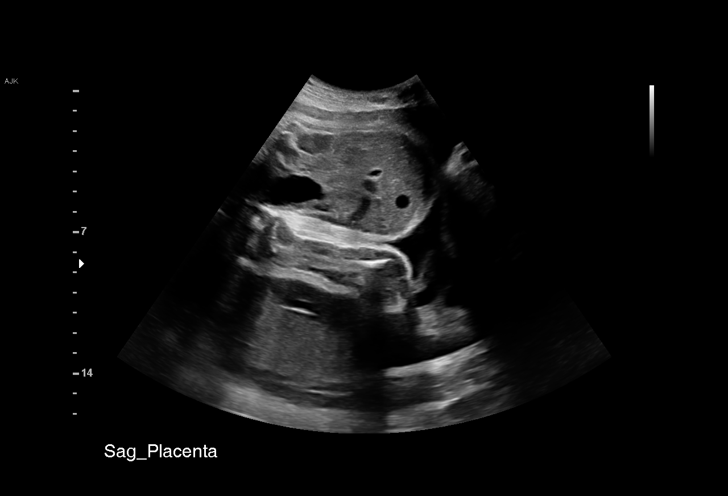
[im 24/28]
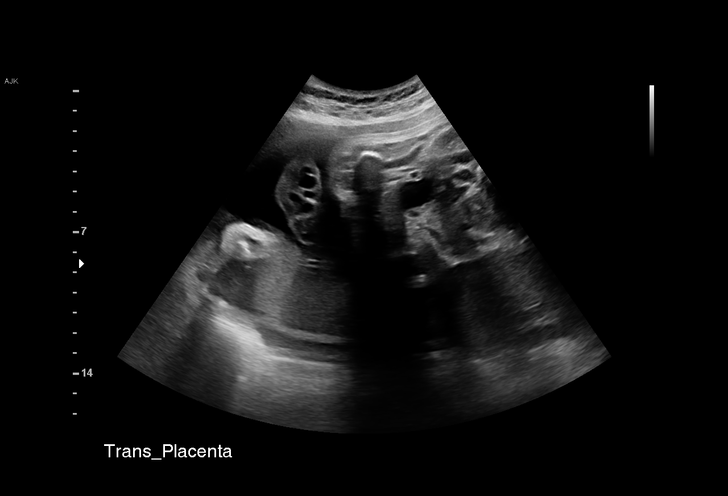
[im 26/28]
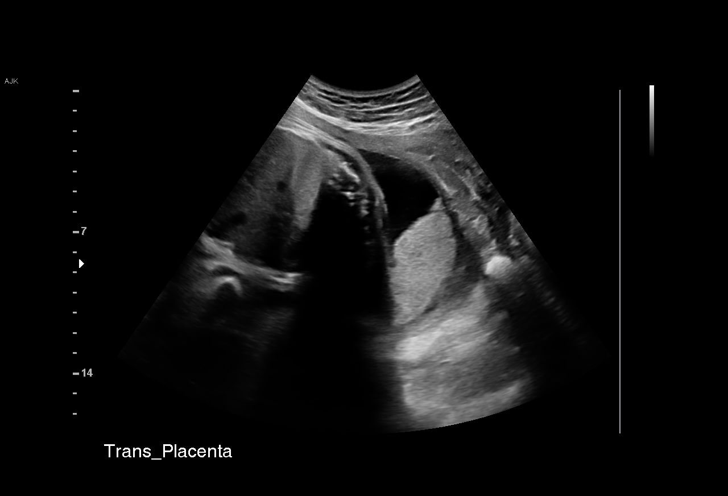
[im 28/28]
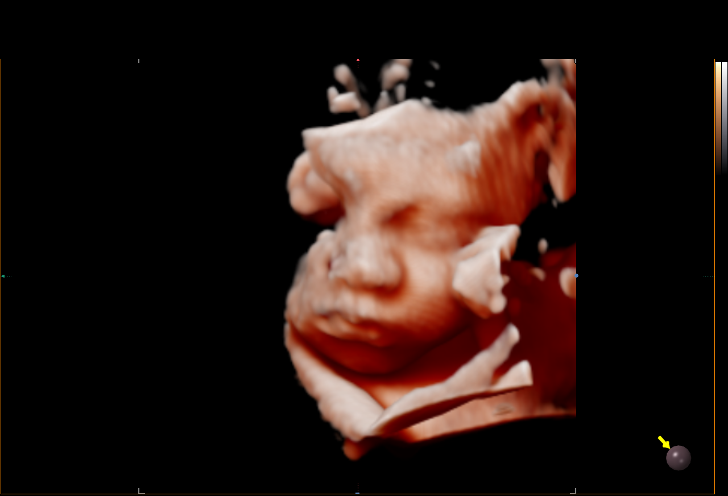

[16 of 28 positions shown; findings below may reference images not displayed]

[facility]

OB/Gyn Clinic

1  LABELLE SALHA BRACK TIGER            [id number]      [id number]     [id number]
Indications

31 weeks gestation of pregnancy
Fetal abnormality - other known or
suspected (UVV)
OB History

Gravidity:    1         Term:   0        Prem:   0        SAB:   0
TOP:          0       Ectopic:  0        Living: 0
Fetal Evaluation

Num Of Fetuses:     1
Fetal Heart         153
Rate(bpm):
Cardiac Activity:   Observed
Presentation:       Cephalic
Placenta:           Posterior, above cervical os
P. Cord Insertion:  Previously Visualized

Amniotic Fluid
AFI FV:      Subjectively within normal limits

AFI Sum(cm)     %Tile       Largest Pocket(cm)
22.17           86

RUQ(cm)       RLQ(cm)       LUQ(cm)        LLQ(cm)
5.46
Biophysical Evaluation

Amniotic F.V:   Increased                  F. Tone:        Observed
F. Movement:    Observed                   Score:          [DATE]
F. Breathing:   Observed
Gestational Age

LMP:           33w 2d       Date:   09/28/15                 EDD:   07/04/16
Best:          31w 4d    Det. By:   Previous Ultrasound      EDD:   07/16/16
(03/02/16)
Impression

Single living intrauterine pregnancy at 25w4d.
Normal amniotic fluid volume.
BPP [DATE].
Recommendations

Continue 2x weekly BPPs, color flow evaluation of UVV
Recommend delivery by 37 weeks gestation

## 2020-03-31 ENCOUNTER — Ambulatory Visit
Admission: RE | Admit: 2020-03-31 | Discharge: 2020-03-31 | Disposition: A | Discharge status: Home / Self Care | Payer: 59 | Source: Ambulatory Visit | Attending: Physician Assistant | Admitting: Physician Assistant

## 2020-03-31 ENCOUNTER — Other Ambulatory Visit: Payer: Self-pay

## 2020-03-31 VITALS — BP 129/90 | HR 88 | Temp 98.4°F | Resp 17

## 2020-03-31 DIAGNOSIS — N939 Abnormal uterine and vaginal bleeding, unspecified: Secondary | ICD-10-CM

## 2020-03-31 LAB — POCT URINE PREGNANCY: Preg Test, Ur: NEGATIVE

## 2020-03-31 MED ORDER — NORGESTREL-ETHINYL ESTRADIOL 0.3-30 MG-MCG PO TABS: 1.0000 | ORAL_TABLET | Freq: Every day | ORAL | 1 refills | Status: DC

## 2020-03-31 NOTE — ED Triage Notes (Signed)
Patient states she has had menstrual cycles that last approximately 15 days for a long time. Pt has not been seen for this previously. Pt stated her cycle started on the 1st and is still going on. Pt is aox4 and ambulatory.

## 2020-03-31 NOTE — Discharge Instructions (Signed)
Follow up with gynecology as scheduled.

## 2020-04-01 ENCOUNTER — Encounter: Payer: Self-pay | Admitting: Obstetrics and Gynecology

## 2020-04-01 NOTE — ED Provider Notes (Signed)
[name]    CSN: [id number] Arrival date & time: 03/31/20  1248      History   Chief Complaint Chief Complaint  Patient presents with  . Vaginal Bleeding    X 2 weeks    HPI Danielle Monroe is a 30 y.o. female.   The history is provided by the patient. No language interpreter was used.  Vaginal Bleeding Quality:  Heavier than menses Severity:  Mild Onset quality:  Gradual Duration:  12 months Timing:  Sporadic Progression:  Worsening Possible pregnancy: no   Relieved by:  Nothing Ineffective treatments:  None tried Associated symptoms: no abdominal pain   Risk factors: no new sexual partner and no STD   Pt reports she has periods that last 10-14 days.  Pt is requesting medication to regulate periods.   Past Medical History:  Diagnosis Date  . Medical history non-contributory     Patient Active Problem List   Diagnosis Date Noted  . 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 Short cervix affecting pregnancy 04/13/2016  . Supervision of high-risk pregnancy 04/13/2016  . Language barrier 04/13/2016    Past Surgical History:  Procedure Laterality Date  . CERVICAL CERCLAGE  02/2016    OB History    Gravida  1   Para  1   Term  1   Preterm  0   AB  0   Living  1     SAB  0   IAB  0   Ectopic  0   Multiple      Live Births  1            Home Medications    Prior to Admission medications   Medication Sig Start Date End Date Taking? Authorizing Provider  norgestrel-ethinyl estradiol (LO/OVRAL) 0.3-30 MG-MCG tablet Take 1 tablet by mouth daily. 03/31/20  Yes Elson Areas, PA-C    Family History History reviewed. No pertinent family history.  Social History Social History   Tobacco Use  . Smoking status: Never Smoker  . Smokeless tobacco: Never Used  Vaping Use  . Vaping Use: Never used  Substance Use Topics  . Alcohol use: Never  . Drug use: Never     Allergies   Patient has no  known allergies.   Review of Systems Review of Systems  Gastrointestinal: Negative for abdominal pain.  Genitourinary: Positive for vaginal bleeding.  All other systems reviewed and are negative.    Physical Exam Triage Vital Signs ED Triage Vitals  Enc Vitals Group     BP 03/31/20 1312 129/90     Pulse Rate 03/31/20 1312 88     Resp 03/31/20 1312 17     Temp 03/31/20 1312 98.4 F (36.9 C)     Temp Source 03/31/20 1312 Oral     SpO2 03/31/20 1312 98 %     Weight --      Height --      Head Circumference --      Peak Flow --      Pain Score 03/31/20 1313 2     Pain Loc --      Pain Edu? --      Excl. in GC? --    No data found.  Updated Vital Signs BP 129/90 (BP Location: Left Arm)   Pulse 88   Temp 98.4 F (36.9 C) (Oral)   Resp 17   LMP 03/18/2020 (Exact Date)   SpO2 98%   Visual Acuity  Right Eye Distance:   Left Eye Distance:   Bilateral Distance:    Right Eye Near:   Left Eye Near:    Bilateral Near:     Physical Exam Vitals and nursing note reviewed.  Constitutional:      Appearance: She is well-developed and well-nourished.  HENT:     Head: Normocephalic.  Eyes:     Extraocular Movements: EOM normal.  Cardiovascular:     Rate and Rhythm: Normal rate.  Pulmonary:     Effort: Pulmonary effort is normal.  Abdominal:     General: Abdomen is flat. There is no distension.     Palpations: Abdomen is soft.  Musculoskeletal:        General: Normal range of motion.     Cervical back: Normal range of motion.  Neurological:     Mental Status: She is alert and oriented to person, place, and time.  Psychiatric:        Mood and Affect: Mood and affect normal.      UC Treatments / Results  Labs (all labs ordered are listed, but only abnormal results are displayed) Labs Reviewed  POCT URINE PREGNANCY    EKG   Radiology No results found.  Procedures Procedures (including critical care time)  Medications Ordered in UC Medications - No  data to display  Initial Impression / Assessment and Plan / UC Course  I have reviewed the triage vital signs and the nursing notes.  Pertinent labs & imaging results that were available during my care of the patient were reviewed by me and considered in my medical decision making (see chart for details).     MDM:  Pt advised she needs Gyn eval with pap.  I will start here no OCp to decrease bleeding  Final Clinical Impressions(s) / UC Diagnoses   Final diagnoses:  Abnormal vaginal bleeding     Discharge Instructions     Follow up with gynecology as scheduled    ED Prescriptions    Medication Sig Dispense Auth. Provider   norgestrel-ethinyl estradiol (LO/OVRAL) 0.3-30 MG-MCG tablet Take 1 tablet by mouth daily. 28 tablet Elson Areas, [place]     PDMP not reviewed this encounter.  An After Visit Summary was printed and given to the patient.    Elson Areas, [place] 04/01/20 [id number]

## 2020-04-15 DIAGNOSIS — N938 Other specified abnormal uterine and vaginal bleeding: Secondary | ICD-10-CM

## 2020-04-15 HISTORY — DX: Other specified abnormal uterine and vaginal bleeding: N93.8

## 2020-05-01 ENCOUNTER — Ambulatory Visit: Payer: Medicaid Other | Admitting: Family Medicine

## 2020-05-06 NOTE — Patient Instructions (Addendum)
Health Maintenance Due  Topic Date Due  . Hepatitis C Screening  Never done  . PAP SMEAR-Modifier  08/05/2019  . INFLUENZA VACCINE  09/16/2019    Depression screen [facility] 2/9 08/03/2016 06/08/2016 05/25/2016  Decreased Interest 1 2 1   Down, Depressed, Hopeless 1 1 1   PHQ - 2 Score 2 3 2   Altered sleeping 0 2 2  Tired, decreased energy 2 2 2   Change in appetite 1 0 0  Feeling bad or failure about yourself  0 0 0  Trouble concentrating 1 1 0  Moving slowly or fidgety/restless 0 1 0  Suicidal thoughts 0 0 0  PHQ-9 Score 6 9 6     Health Maintenance, Female Adopting a healthy lifestyle and getting preventive care are important in promoting health and wellness. Ask your health care provider about:  The right schedule for you to have regular tests and exams.  Things you can do on your own to prevent diseases and keep yourself healthy. What should I know about diet, weight, and exercise? Eat a healthy diet  Eat a diet that includes plenty of vegetables, fruits, low-fat dairy products, and lean protein.  Do not eat a lot of foods that are high in solid fats, added sugars, or sodium.   Maintain a healthy weight Body mass index (BMI) is used to identify weight problems. It estimates body fat based on height and weight. Your health care provider can help determine your BMI and help you achieve or maintain a healthy weight. Get regular exercise Get regular exercise. This is one of the most important things you can do for your health. Most adults should:  Exercise for at least 150 minutes each week. The exercise should increase your heart rate and make you sweat (moderate-intensity exercise).  Do strengthening exercises at least twice a week. This is in addition to the moderate-intensity exercise.  Spend less time sitting. Even light physical activity can be beneficial. Watch cholesterol and blood lipids Have your blood tested for lipids and cholesterol at 32 years of age, then have this test  every 5 years. Have your cholesterol levels checked more often if:  Your lipid or cholesterol levels are high.  You are older than 32 years of age.  You are at high risk for heart disease. What should I know about cancer screening? Depending on your health history and family history, you may need to have cancer screening at various ages. This may include screening for:  Breast cancer.  Cervical cancer.  Colorectal cancer.  Skin cancer.  Lung cancer. What should I know about heart disease, diabetes, and high blood pressure? Blood pressure and heart disease  High blood pressure causes heart disease and increases the risk of stroke. This is more likely to develop in people who have high blood pressure readings, are of African descent, or are overweight.  Have your blood pressure checked: ? Every 3-5 years if you are 72-51 years of age. ? Every year if you are 15 years old or older. Diabetes Have regular diabetes screenings. This checks your fasting blood sugar level. Have the screening done:  Once every three years after age 28 if you are at a normal weight and have a low risk for diabetes.  More often and at a younger age if you are overweight or have a high risk for diabetes. What should I know about preventing infection? Hepatitis B If you have a higher risk for hepatitis B, you should be screened for this virus. Talk  with your health care provider to find out if you are at risk for hepatitis B infection. Hepatitis C Testing is recommended for:  Everyone born from [birth year] through 1967.  Anyone with known risk factors for hepatitis C. Sexually transmitted infections (STIs)  Get screened for STIs, including gonorrhea and chlamydia, if: ? You are sexually active and are younger than 32 years of age. ? You are older than 32 years of age and your health care provider tells you that you are at risk for this type of infection. ? Your sexual activity has changed since you were  last screened, and you are at increased risk for chlamydia or gonorrhea. Ask your health care provider if you are at risk.  Ask your health care provider about whether you are at high risk for HIV. Your health care provider may recommend a prescription medicine to help prevent HIV infection. If you choose to take medicine to prevent HIV, you should first get tested for HIV. You should then be tested every 3 months for as long as you are taking the medicine. Pregnancy  If you are about to stop having your period (premenopausal) and you may become pregnant, seek counseling before you get pregnant.  Take 400 to 800 micrograms (mcg) of folic acid every day if you become pregnant.  Ask for birth control (contraception) if you want to prevent pregnancy. Osteoporosis and menopause Osteoporosis is a disease in which the bones lose minerals and strength with aging. This can result in bone fractures. If you are 53 years old or older, or if you are at risk for osteoporosis and fractures, ask your health care provider if you should:  Be screened for bone loss.  Take a calcium or vitamin D supplement to lower your risk of fractures.  Be given hormone replacement therapy (HRT) to treat symptoms of menopause. Follow these instructions at home: Lifestyle  Do not use any products that contain nicotine or tobacco, such as cigarettes, e-cigarettes, and chewing tobacco. If you need help quitting, ask your health care provider.  Do not use street drugs.  Do not share needles.  Ask your health care provider for help if you need support or information about quitting drugs. Alcohol use  Do not drink alcohol if: ? Your health care provider tells you not to drink. ? You are pregnant, may be pregnant, or are planning to become pregnant.  If you drink alcohol: ? Limit how much you use to 0-1 drink a day. ? Limit intake if you are breastfeeding.  Be aware of how much alcohol is in your drink. In the [place]  one drink equals one 12 oz bottle of beer (355 mL), one 5 oz glass of wine (148 mL), or one 1 oz glass of hard liquor (44 mL). General instructions  Schedule regular health, dental, and eye exams.  Stay current with your vaccines.  Tell your health care provider if: ? You often feel depressed. ? You have ever been abused or do not feel safe at home. Summary  Adopting a healthy lifestyle and getting preventive care are important in promoting health and wellness.  Follow your health care provider's instructions about healthy diet, exercising, and getting tested or screened for diseases.  Follow your health care provider's instructions on monitoring your cholesterol and blood pressure. This information is not intended to replace advice given to you by your health care provider. Make sure you discuss any questions you have with your health care provider. Document Revised: 01/25/2018  Document Reviewed: 01/25/2018 Elsevier Patient Education  [place].

## 2020-05-08 ENCOUNTER — Other Ambulatory Visit: Payer: Self-pay

## 2020-05-08 ENCOUNTER — Encounter: Payer: Self-pay | Admitting: Family Medicine

## 2020-05-08 ENCOUNTER — Ambulatory Visit (INDEPENDENT_AMBULATORY_CARE_PROVIDER_SITE_OTHER): Payer: 59 | Admitting: Family Medicine

## 2020-05-08 VITALS — BP 110/82 | HR 80 | Temp 98.1°F | Ht 61.25 in | Wt 133.4 lb

## 2020-05-08 DIAGNOSIS — Z0000 Encounter for general adult medical examination without abnormal findings: Secondary | ICD-10-CM | POA: Diagnosis not present

## 2020-05-08 DIAGNOSIS — Z13220 Encounter for screening for lipoid disorders: Secondary | ICD-10-CM

## 2020-05-08 DIAGNOSIS — Z1329 Encounter for screening for other suspected endocrine disorder: Secondary | ICD-10-CM

## 2020-05-08 DIAGNOSIS — H01133 Eczematous dermatitis of right eye, unspecified eyelid: Secondary | ICD-10-CM

## 2020-05-08 DIAGNOSIS — Z789 Other specified health status: Secondary | ICD-10-CM

## 2020-05-08 DIAGNOSIS — Z7689 Persons encountering health services in other specified circumstances: Secondary | ICD-10-CM | POA: Diagnosis not present

## 2020-05-08 DIAGNOSIS — Z1321 Encounter for screening for nutritional disorder: Secondary | ICD-10-CM

## 2020-05-08 MED ORDER — TACROLIMUS 0.1 % EX OINT: TOPICAL_OINTMENT | Freq: Two times a day (BID) | CUTANEOUS | 0 refills | Status: DC

## 2020-05-08 NOTE — Progress Notes (Signed)
Danielle Monroe is a 30 y.o. female  Chief Complaint  Patient presents with  . Establish Care    Np her for physical.  Pt not fasting, and she has an appointment coming up with GYN for pap smear.  Pt would like a referral for her rt eye to be looked at.  Pt c/o rt eye rash worsening over 2 months.    HPI: Danielle Monroe is a 30 y.o. female patient here to establish care with our office and for annual CPE, labs. She is not fasting this AM - had fried chips. She is accompanied by her husband who is also establishing care with me today. They have a 4yo daughter.  Last PAP: has upcoming appt with GYN Dr. Nettie Elm  Diet/Exercise: average diet, vegetarian; no regular exercise Dental: UTD Vision: no glasses or contacts or vision issues  Med refills needed today? No  Pt requests referral to eye doctor for eval of Rt eye "rash" x 2 mo, getting worse. She notes dry skin on lid and beneath eye. Itchy at time. No vision issues.    Past Medical History:  Diagnosis Date  . Medical history non-contributory     Past Surgical History:  Procedure Laterality Date  . CERVICAL CERCLAGE  02/2016    Social History   Socioeconomic History  . Marital status: Married    Spouse name: Not on file  . Number of children: Not on file  . Years of education: Not on file  . Highest education level: Not on file  Occupational History  . Not on file  Tobacco Use  . Smoking status: Never Smoker  . Smokeless tobacco: Never Used  Vaping Use  . Vaping Use: Never used  Substance and Sexual Activity  . Alcohol use: Never  . Drug use: Never  . Sexual activity: Yes    Birth control/protection: None  Other Topics Concern  . Not on file  Social History Narrative   ** Merged History Encounter **       Social Determinants of Health   Financial Resource Strain: Not on file  Food Insecurity: Not on file  Transportation Needs: Not on file  Physical Activity: Not on file   Stress: Not on file  Social Connections: Not on file  Intimate Partner Violence: Not on file    History reviewed. No pertinent family history.   Immunization History  Administered Date(s) Administered  . Influenza,inj,quad, With Preservative 05/11/2016  . Tdap 04/28/2016    Outpatient Encounter Medications as of 05/08/2020  Medication Sig  . norgestrel-ethinyl estradiol (LO/OVRAL) 0.3-30 MG-MCG tablet Take 1 tablet by mouth daily. (Patient not taking: Reported on 05/08/2020)   No facility-administered encounter medications on file as of 05/08/2020.     ROS: Gen: no fever, chills  Skin: no rash, itching ENT: no ear pain, ear drainage, nasal congestion, rhinorrhea, sinus pressure, sore throat Eyes: no blurry vision, double vision Resp: no cough, wheeze,SOB Breast: no breast tenderness, no nipple discharge, no breast masses CV: no CP, palpitations, LE edema,  GI: no heartburn, n/v/d/c, abd pain GU: no dysuria, urgency, frequency, hematuria MSK: no joint pain, myalgias, back pain Neuro: no dizziness, headache, weakness, vertigo Psych: no depression, anxiety, insomnia   No Known Allergies  BP 110/82 (BP Location: Left Arm, Patient Position: Sitting, Cuff Size: Normal)   Pulse 80   Temp 98.1 F (36.7 C) (Temporal)   Ht 5' 1.25" (1.556 m)   Wt 133 lb 6.4 oz (60.5 kg)  LMP 04/25/2020   SpO2 98%   BMI 25.00 kg/m   Wt Readings from Last 3 Encounters:  05/08/20 133 lb 6.4 oz (60.5 kg)  07/12/17 124 lb 1.6 oz (56.3 kg)  08/03/16 127 lb 4.8 oz (57.7 kg)   Temp Readings from Last 3 Encounters:  05/08/20 98.1 F (36.7 C) (Temporal)  03/31/20 98.4 F (36.9 C) (Oral)  06/27/16 98 F (36.7 C) (Oral)   BP Readings from Last 3 Encounters:  05/08/20 110/82  03/31/20 129/90  07/12/17 122/82   Pulse Readings from Last 3 Encounters:  05/08/20 80  03/31/20 88  07/12/17 90     Physical Exam Constitutional:      General: She is not in acute distress.    Appearance:  Normal appearance. She is well-developed. She is not ill-appearing.  HENT:     Head: Normocephalic and atraumatic.     Right Ear: Tympanic membrane and ear canal normal.     Left Ear: Tympanic membrane and ear canal normal.     Nose: Nose normal.     Mouth/Throat:     Mouth: Mucous membranes are moist.     Pharynx: Oropharynx is clear.  Eyes:     General: Vision grossly intact.     Conjunctiva/sclera: Conjunctivae normal.     Right eye: Right conjunctiva is not injected.     Left eye: Left conjunctiva is not injected.     Comments: Dry skin on Rt upper lid and small area of the same on lower lid, lateral aspect  Neck:     Thyroid: No thyromegaly.  Cardiovascular:     Rate and Rhythm: Normal rate and regular rhythm.     Heart sounds: Normal heart sounds. No murmur heard.   Pulmonary:     Effort: Pulmonary effort is normal. No respiratory distress.     Breath sounds: Normal breath sounds. No wheezing or rhonchi.  Abdominal:     General: Bowel sounds are normal. There is no distension.     Palpations: Abdomen is soft. There is no mass.     Tenderness: There is no abdominal tenderness.  Musculoskeletal:     Cervical back: Neck supple.     Right lower leg: No edema.     Left lower leg: No edema.  Lymphadenopathy:     Cervical: No cervical adenopathy.  Skin:    General: Skin is warm and dry.  Neurological:     Mental Status: She is alert and oriented to person, place, and time.     Motor: No abnormal muscle tone.     Coordination: Coordination normal.  Psychiatric:        Mood and Affect: Mood normal.        Behavior: Behavior normal.      A/P:  1. Annual physical exam - discussed importance of regular CV exercise, healthy diet, adequate sleep - UTD on dental, no vision issues - PAP scheduled with GYN - immunizations UTD - ALT; Future - AST; Future - Basic metabolic panel; Future - CBC; Future - next CPE in 1 year  2. Encounter to establish care with new  doctor  3. Screening for lipid disorders - Lipid panel; Future  4. Screening for thyroid disorder - TSH; Future  5. Encounter for vitamin deficiency screening - VITAMIN D 25 Hydroxy (Vit-D Deficiency, Fractures); Future - Vitamin B12; Future  6. Vegetarian diet - Vitamin B12; Future  7. Eyelid dermatitis, eczematous, right Rx: - tacrolimus (PROTOPIC) 0.1 % ointment; Apply topically 2 (  two) times daily.  Dispense: 30 g; Refill: 0 - f/u if symptoms worsen or do not improve in 3-4wks   This visit occurred during the SARS-CoV-2 public health emergency.  Safety protocols were in place, including screening questions prior to the visit, additional usage of staff PPE, and extensive cleaning of exam room while observing appropriate contact time as indicated for disinfecting solutions.

## 2020-05-09 ENCOUNTER — Other Ambulatory Visit: Payer: Self-pay | Admitting: Family Medicine

## 2020-05-09 ENCOUNTER — Other Ambulatory Visit (INDEPENDENT_AMBULATORY_CARE_PROVIDER_SITE_OTHER): Payer: 59

## 2020-05-09 DIAGNOSIS — Z1329 Encounter for screening for other suspected endocrine disorder: Secondary | ICD-10-CM

## 2020-05-09 DIAGNOSIS — E538 Deficiency of other specified B group vitamins: Secondary | ICD-10-CM | POA: Insufficient documentation

## 2020-05-09 DIAGNOSIS — E559 Vitamin D deficiency, unspecified: Secondary | ICD-10-CM | POA: Insufficient documentation

## 2020-05-09 DIAGNOSIS — Z0000 Encounter for general adult medical examination without abnormal findings: Secondary | ICD-10-CM | POA: Diagnosis not present

## 2020-05-09 DIAGNOSIS — Z1321 Encounter for screening for nutritional disorder: Secondary | ICD-10-CM

## 2020-05-09 DIAGNOSIS — Z13220 Encounter for screening for lipoid disorders: Secondary | ICD-10-CM

## 2020-05-09 DIAGNOSIS — Z789 Other specified health status: Secondary | ICD-10-CM | POA: Diagnosis not present

## 2020-05-09 LAB — CBC
HCT: 36.2 % (ref 36.0–46.0)
Hemoglobin: 12.1 g/dL (ref 12.0–15.0)
MCHC: 33.4 g/dL (ref 30.0–36.0)
MCV: 83.2 fl (ref 78.0–100.0)
Platelets: 270 10*3/uL (ref 150.0–400.0)
RBC: 4.36 Mil/uL (ref 3.87–5.11)
RDW: 13.4 % (ref 11.5–15.5)
WBC: 5.5 10*3/uL (ref 4.0–10.5)

## 2020-05-09 LAB — BASIC METABOLIC PANEL
BUN: 6 mg/dL (ref 6–23)
CO2: 27 mEq/L (ref 19–32)
Calcium: 9.4 mg/dL (ref 8.4–10.5)
Chloride: 104 mEq/L (ref 96–112)
Creatinine, Ser: 0.61 mg/dL (ref 0.40–1.20)
GFR: 118.76 mL/min (ref >60.00)
Glucose, Bld: 94 mg/dL (ref 70–99)
Potassium: 4.3 mEq/L (ref 3.5–5.1)
Sodium: 138 mEq/L (ref 135–145)

## 2020-05-09 LAB — TSH: TSH: 1.78 u[IU]/mL (ref 0.35–4.50)

## 2020-05-09 LAB — LIPID PANEL
Cholesterol: 205 mg/dL — ABNORMAL HIGH (ref 0–200)
HDL: 46.6 mg/dL (ref >39.00)
LDL Cholesterol: 137 mg/dL — ABNORMAL HIGH (ref 0–99)
NonHDL: 158.7
Total CHOL/HDL Ratio: 4
Triglycerides: 110 mg/dL (ref 0.0–149.0)
VLDL: 22 mg/dL (ref 0.0–40.0)

## 2020-05-09 LAB — AST: AST: 14 U/L (ref 0–37)

## 2020-05-09 LAB — VITAMIN B12: Vitamin B-12: 166 pg/mL — ABNORMAL LOW (ref 211–911)

## 2020-05-09 LAB — ALT: ALT: 17 U/L (ref 0–35)

## 2020-05-09 LAB — VITAMIN D 25 HYDROXY (VIT D DEFICIENCY, FRACTURES): VITD: 20.31 ng/mL — ABNORMAL LOW (ref 30.00–100.00)

## 2020-05-09 MED ORDER — VITAMIN D (ERGOCALCIFEROL) 1.25 MG (50000 UNIT) PO CAPS: 50000.0000 [IU] | ORAL_CAPSULE | ORAL | 2 refills | Status: DC

## 2020-05-12 ENCOUNTER — Other Ambulatory Visit (HOSPITAL_COMMUNITY)
Admission: RE | Admit: 2020-05-12 | Discharge: 2020-05-12 | Disposition: A | Discharge status: Home / Self Care | Payer: 59 | Source: Ambulatory Visit | Attending: Obstetrics and Gynecology | Admitting: Obstetrics and Gynecology

## 2020-05-12 ENCOUNTER — Ambulatory Visit (INDEPENDENT_AMBULATORY_CARE_PROVIDER_SITE_OTHER): Payer: 59 | Admitting: Obstetrics and Gynecology

## 2020-05-12 ENCOUNTER — Encounter: Payer: Self-pay | Admitting: Obstetrics and Gynecology

## 2020-05-12 ENCOUNTER — Other Ambulatory Visit: Payer: Self-pay | Admitting: Obstetrics and Gynecology

## 2020-05-12 ENCOUNTER — Other Ambulatory Visit: Payer: Self-pay

## 2020-05-12 DIAGNOSIS — Z01419 Encounter for gynecological examination (general) (routine) without abnormal findings: Secondary | ICD-10-CM | POA: Insufficient documentation

## 2020-05-12 DIAGNOSIS — N938 Other specified abnormal uterine and vaginal bleeding: Secondary | ICD-10-CM | POA: Diagnosis not present

## 2020-05-12 DIAGNOSIS — N6452 Nipple discharge: Secondary | ICD-10-CM | POA: Insufficient documentation

## 2020-05-12 DIAGNOSIS — Z0000 Encounter for general adult medical examination without abnormal findings: Secondary | ICD-10-CM | POA: Insufficient documentation

## 2020-05-12 HISTORY — DX: Nipple discharge: N64.52

## 2020-05-12 NOTE — Patient Instructions (Signed)
Galactorrhea Galactorrhea is the flow of a milky fluid (discharge) from the breast. It is different from normal milk in nursing mothers. The fluid can be white, yellow, or green. This condition can be caused by many things. Most cases are not serious and do not require treatment. Watch your condition to make sure it goes away. What are the causes?  Irritation of the breast. This may be caused by: ? Injury on the breast. ? Touching breasts during sex. ? Clothes rubbing against the nipple.  Some medicines, birth control pills, or herbs.  Changes in hormones.  Stress. What are the signs or symptoms? The main symptom of this condition is a milky discharge from the breast. The discharge may be white, yellow, or green. How is this treated? You may get well without treatment. Your doctor will watch your condition to make sure that it gets better. Sometimes your doctor will decide that you need treatment. If you need treatment:  The doctor will treat any injury or irritation on your breast.  The doctor will treat you for problems in your hormones.  You may be asked to stop some medicines, if they are causing your symptoms. Follow these instructions at home: Breast care  Watch your condition for any changes.  Do not squeeze your breasts or nipples.  Avoid touching your breasts when you are having sex.  Perform a breast self-exam once a month.  Avoid clothes that rub on your nipples.  Use breast pads to absorb the fluid.  Wear a support bra or a breast binder.   General instructions  Take over-the-counter and prescription medicines only as told by your doctor.  Keep all follow-up visits. Contact a doctor if:  You have hot flashes.  You have vaginal dryness.  You have no desire for sex.  You stop having periods, or have periods that are irregular or far apart.  You have headaches.  You cannot see well. Get help right away if:  Your breast discharge is bloody or  yellowish and looks like pus.  You have breast pain.  You feel a lump in your breast.  Your breast shows wrinkling or dimpling.  Your breast becomes red and swollen. Summary  Galactorrhea is the flow of a milky fluid (discharge) from the breast.  This condition may be caused by many things, but it is not usually serious.  Watch your condition carefully to make sure that it goes away.  Get help right away if the fluid is bloody or yellowish, or if you have a lump, pain, or skin changes on your breast. This information is not intended to replace advice given to you by your health care provider. Make sure you discuss any questions you have with your health care provider. Document Revised: 12/03/2019 Document Reviewed: 12/03/2019 Elsevier Patient Education  2021 Elsevier Inc. Health Maintenance, Female Adopting a healthy lifestyle and getting preventive care are important in promoting health and wellness. Ask your health care provider about:  The right schedule for you to have regular tests and exams.  Things you can do on your own to prevent diseases and keep yourself healthy. What should I know about diet, weight, and exercise? Eat a healthy diet  Eat a diet that includes plenty of vegetables, fruits, low-fat dairy products, and lean protein.  Do not eat a lot of foods that are high in solid fats, added sugars, or sodium.   Maintain a healthy weight Body mass index (BMI) is used to identify weight problems. It  estimates body fat based on height and weight. Your health care provider can help determine your BMI and help you achieve or maintain a healthy weight. Get regular exercise Get regular exercise. This is one of the most important things you can do for your health. Most adults should:  Exercise for at least 150 minutes each week. The exercise should increase your heart rate and make you sweat (moderate-intensity exercise).  Do strengthening exercises at least twice a week.  This is in addition to the moderate-intensity exercise.  Spend less time sitting. Even light physical activity can be beneficial. Watch cholesterol and blood lipids Have your blood tested for lipids and cholesterol at 32 years of age, then have this test every 5 years. Have your cholesterol levels checked more often if:  Your lipid or cholesterol levels are high.  You are older than 32 years of age.  You are at high risk for heart disease. What should I know about cancer screening? Depending on your health history and family history, you may need to have cancer screening at various ages. This may include screening for:  Breast cancer.  Cervical cancer.  Colorectal cancer.  Skin cancer.  Lung cancer. What should I know about heart disease, diabetes, and high blood pressure? Blood pressure and heart disease  High blood pressure causes heart disease and increases the risk of stroke. This is more likely to develop in people who have high blood pressure readings, are of African descent, or are overweight.  Have your blood pressure checked: ? Every 3-5 years if you are 41-39 years of age. ? Every year if you are 80 years old or older. Diabetes Have regular diabetes screenings. This checks your fasting blood sugar level. Have the screening done:  Once every three years after age 42 if you are at a normal weight and have a low risk for diabetes.  More often and at a younger age if you are overweight or have a high risk for diabetes. What should I know about preventing infection? Hepatitis B If you have a higher risk for hepatitis B, you should be screened for this virus. Talk with your health care provider to find out if you are at risk for hepatitis B infection. Hepatitis C Testing is recommended for:  Everyone born from [birth year] through 1967.  Anyone with known risk factors for hepatitis C. Sexually transmitted infections (STIs)  Get screened for STIs, including gonorrhea and  chlamydia, if: ? You are sexually active and are younger than 33 years of age. ? You are older than 32 years of age and your health care provider tells you that you are at risk for this type of infection. ? Your sexual activity has changed since you were last screened, and you are at increased risk for chlamydia or gonorrhea. Ask your health care provider if you are at risk.  Ask your health care provider about whether you are at high risk for HIV. Your health care provider may recommend a prescription medicine to help prevent HIV infection. If you choose to take medicine to prevent HIV, you should first get tested for HIV. You should then be tested every 3 months for as long as you are taking the medicine. Pregnancy  If you are about to stop having your period (premenopausal) and you may become pregnant, seek counseling before you get pregnant.  Take 400 to 800 micrograms (mcg) of folic acid every day if you become pregnant.  Ask for birth control (contraception) if you  want to prevent pregnancy. Osteoporosis and menopause Osteoporosis is a disease in which the bones lose minerals and strength with aging. This can result in bone fractures. If you are 6 years old or older, or if you are at risk for osteoporosis and fractures, ask your health care provider if you should:  Be screened for bone loss.  Take a calcium or vitamin D supplement to lower your risk of fractures.  Be given hormone replacement therapy (HRT) to treat symptoms of menopause. Follow these instructions at home: Lifestyle  Do not use any products that contain nicotine or tobacco, such as cigarettes, e-cigarettes, and chewing tobacco. If you need help quitting, ask your health care provider.  Do not use street drugs.  Do not share needles.  Ask your health care provider for help if you need support or information about quitting drugs. Alcohol use  Do not drink alcohol if: ? Your health care provider tells you not to  drink. ? You are pregnant, may be pregnant, or are planning to become pregnant.  If you drink alcohol: ? Limit how much you use to 0-1 drink a day. ? Limit intake if you are breastfeeding.  Be aware of how much alcohol is in your drink. In the [place] one drink equals one 12 oz bottle of beer (355 mL), one 5 oz glass of wine (148 mL), or one 1 oz glass of hard liquor (44 mL). General instructions  Schedule regular health, dental, and eye exams.  Stay current with your vaccines.  Tell your health care provider if: ? You often feel depressed. ? You have ever been abused or do not feel safe at home. Summary  Adopting a healthy lifestyle and getting preventive care are important in promoting health and wellness.  Follow your health care provider's instructions about healthy diet, exercising, and getting tested or screened for diseases.  Follow your health care provider's instructions on monitoring your cholesterol and blood pressure. This information is not intended to replace advice given to you by your health care provider. Make sure you discuss any questions you have with your health care provider. Document Revised: 01/25/2018 Document Reviewed: 01/25/2018 Elsevier Patient Education  2021 [employer].

## 2020-05-12 NOTE — Progress Notes (Signed)
Patient ID: Danielle Monroe, female   DOB: 1990/12/14, 30 y.o.   MRN: [id number]  Danielle Monroe is a 29 y.o. G12P1001 female here for a routine annual gynecologic exam. Pt reports irregular bleeding to some degree every day for the last yr. Was seen at [facility] for this last month and placed on OCP's. Pt took for one month she reports normal cycle this month, no BTB this month. Desires pregnancy. H/O shorten cervic with last pregnancy, had cerclage. TSVD 06/2016 without problems. Also some right breast discharge only with manipulation.       Gynecologic History Patient's last menstrual period was 04/25/2020 (exact date). Contraception: none   Obstetric History OB History  Gravida Para Term Preterm AB Living  1 1 1  0 0 1  SAB IAB Ectopic Multiple Live Births  0 0 0   1    # Outcome Date GA Lbr Len/2nd Weight Sex Delivery Anes PTL Lv  1 Term 06/25/16 [redacted]w[redacted]d 02:33 / 01:15 5 lb 9.1 oz (2.526 kg) F Vag-Spont Local  LIV    Past Medical History:  Diagnosis Date  . Medical history non-contributory     Past Surgical History:  Procedure Laterality Date  . CERVICAL CERCLAGE  02/2016    Current Outpatient Medications on File Prior to Visit  Medication Sig Dispense Refill  . tacrolimus (PROTOPIC) 0.1 % ointment Apply topically 2 (two) times daily. 30 g 0   No current facility-administered medications on file prior to visit.    No Known Allergies  Social History   Socioeconomic History  . Marital status: Married    Spouse name: Not on file  . Number of children: Not on file  . Years of education: Not on file  . Highest education level: Not on file  Occupational History  . Not on file  Tobacco Use  . Smoking status: Never Smoker  . Smokeless tobacco: Never Used  Vaping Use  . Vaping Use: Never used  Substance and Sexual Activity  . Alcohol use: Never  . Drug use: Never  . Sexual activity: Yes    Birth control/protection: Pill, None  Other Topics Concern  .  Not on file  Social History Narrative   ** Merged History Encounter **       Social Determinants of Health   Financial Resource Strain: Not on file  Food Insecurity: Not on file  Transportation Needs: Not on file  Physical Activity: Not on file  Stress: Not on file  Social Connections: Not on file  Intimate Partner Violence: Not on file    History reviewed. No pertinent family history.  The following portions of the patient's history were reviewed and updated as appropriate: allergies, current medications, past family history, past medical history, past social history, past surgical history and problem list.  Review of Systems Pertinent items noted in HPI and remainder of comprehensive ROS otherwise negative.   Objective:  BP (!) 140/103   Pulse 95   Wt 131 lb 14.4 oz (59.8 kg)   LMP 04/25/2020 (Exact Date)   BMI 24.72 kg/m  CONSTITUTIONAL: Well-developed, well-nourished female in no acute distress.  HENT:  Normocephalic, atraumatic, External right and left ear normal. Oropharynx is clear and moist EYES: Conjunctivae and EOM are normal. Pupils are equal, round, and reactive to light. No scleral icterus.  NECK: Normal range of motion, supple, no masses.  Normal thyroid.  SKIN: Skin is warm and dry. No rash noted. Not diaphoretic. No erythema. No pallor. NEUROLGIC: Alert and  oriented to person, place, and time. Normal reflexes, muscle tone coordination. No cranial nerve deficit noted. PSYCHIATRIC: Normal mood and affect. Normal behavior. Normal judgment and thought content. CARDIOVASCULAR: Normal heart rate noted, regular rhythm RESPIRATORY: Clear to auscultation bilaterally. Effort and breath sounds normal, no problems with respiration noted. BREASTS: Symmetric in size. No masses, skin changes, nipple drainage, or lymphadenopathy. ABDOMEN: Soft, normal bowel sounds, no distention noted.  No tenderness, rebound or guarding.  PELVIC: Normal appearing external genitalia; normal  appearing vaginal mucosa,  Cervix with nathobain cyst   No abnormal discharge noted.  Pap smear obtained.  Normal uterine size, no other palpable masses, no uterine or adnexal tenderness. MUSCULOSKELETAL: Normal range of motion. No tenderness.  No cyanosis, clubbing, or edema.  2+ distal pulses.   Assessment:  Annual gynecologic examination with pap smear Right breast discharge DUB Desire for pregnancy Plan:  Will follow up results of pap smear and manage accordingly. It appears that DUB has resolved. However will check GYN U/S and pt instructed to monitor cycle. Encouraged QD PNV. Will check Prolactin and Breast U/S  Routine preventative health maintenance measures emphasized. Please refer to After Visit Summary for other counseling recommendations.    Hermina Staggers, MD, FACOG Attending Obstetrician & Gynecologist [facility]

## 2020-05-12 NOTE — Progress Notes (Signed)
Patient is here for because she wants to have another baby and is having breast issues. Right breast is leaking white/yellow discharge and it happens when breast Is being "pressed on". This started a year ago. Stated that she was on birth control pill last month (took the for 1 month) for irregular menstrual & bleeding and then stopped. She is no longer bleeding.  LMP: March 11 period that lasted 7 days  Took her blood pressure 2x.  1st reading 140/103 pulse 95 2nd reading 137/101 pulse N/A

## 2020-05-13 LAB — PROLACTIN: Prolactin: 7.1 ng/mL (ref 4.8–23.3)

## 2020-05-14 LAB — CYTOLOGY - PAP
Comment: NEGATIVE
Diagnosis: NEGATIVE
High risk HPV: NEGATIVE

## 2020-05-19 ENCOUNTER — Ambulatory Visit
Admission: RE | Admit: 2020-05-19 | Discharge: 2020-05-19 | Disposition: A | Discharge status: Home / Self Care | Payer: 59 | Source: Ambulatory Visit | Attending: Obstetrics and Gynecology | Admitting: Obstetrics and Gynecology

## 2020-05-19 ENCOUNTER — Other Ambulatory Visit: Payer: Self-pay

## 2020-05-19 DIAGNOSIS — N938 Other specified abnormal uterine and vaginal bleeding: Secondary | ICD-10-CM | POA: Diagnosis present

## 2020-05-19 DIAGNOSIS — Z01419 Encounter for gynecological examination (general) (routine) without abnormal findings: Secondary | ICD-10-CM | POA: Diagnosis present

## 2020-06-03 ENCOUNTER — Ambulatory Visit (INDEPENDENT_AMBULATORY_CARE_PROVIDER_SITE_OTHER): Payer: 59

## 2020-06-03 ENCOUNTER — Other Ambulatory Visit: Payer: Self-pay

## 2020-06-03 DIAGNOSIS — E538 Deficiency of other specified B group vitamins: Secondary | ICD-10-CM

## 2020-06-03 MED ORDER — CYANOCOBALAMIN 1000 MCG/ML IJ SOLN
1000.0000 ug | Freq: Once | INTRAMUSCULAR | Status: AC
  Administered 2020-06-03: 1000 ug via INTRAMUSCULAR

## 2020-06-03 NOTE — Progress Notes (Signed)
Pt came in for nurse visit to have B12 injection per order from Dr. Barron Alvine. Injection was administered to right deltoid. Pt tolerated injection well.

## 2020-06-10 ENCOUNTER — Ambulatory Visit: Payer: 59

## 2020-06-17 ENCOUNTER — Ambulatory Visit: Payer: 59

## 2020-06-23 ENCOUNTER — Other Ambulatory Visit: Payer: Self-pay

## 2020-06-23 ENCOUNTER — Ambulatory Visit
Admission: RE | Admit: 2020-06-23 | Discharge: 2020-06-23 | Disposition: A | Discharge status: Home / Self Care | Payer: 59 | Source: Ambulatory Visit | Attending: Obstetrics and Gynecology | Admitting: Obstetrics and Gynecology

## 2020-06-23 DIAGNOSIS — N6452 Nipple discharge: Secondary | ICD-10-CM

## 2020-06-24 ENCOUNTER — Ambulatory Visit: Payer: 59

## 2020-12-24 ENCOUNTER — Other Ambulatory Visit: Payer: Self-pay

## 2020-12-24 ENCOUNTER — Ambulatory Visit (INDEPENDENT_AMBULATORY_CARE_PROVIDER_SITE_OTHER): Payer: 59 | Admitting: *Deleted

## 2020-12-24 DIAGNOSIS — Z3201 Encounter for pregnancy test, result positive: Secondary | ICD-10-CM | POA: Diagnosis not present

## 2020-12-24 DIAGNOSIS — Z3200 Encounter for pregnancy test, result unknown: Secondary | ICD-10-CM

## 2020-12-24 LAB — POCT PREGNANCY, URINE: Preg Test, Ur: POSITIVE — AB

## 2020-12-24 NOTE — Progress Notes (Signed)
Patient left urine for pregnancy test which was positive. I called her with [facility] [phone] . I informed her pregnancy test was positive. She reports LMP 11/08/20 . This makes her 101w4d with EDD 08/15/21. I advised she should start prenatal care with provider her choice and letter with providers placed in her MyChart. I advised her to start prenatal vitamins. She states she would like to get prenatal care with our office because she went here before. I informed her I will ask front office to call her to schedule. She states she is going to [place] in 3 days and asked for an appointment end of February when she will be back. I advised her to start prenatal care in [place]. I reveiwed meds with her. She voices understanding. Lucille Crichlow,RN

## 2020-12-24 NOTE — Patient Instructions (Signed)
Prenatal Care Providers           [facility]  [address] [phone]  [facility]   [address]  [phone]  [facility] @ [place]       [address] [phone]            [facility] @ [place]     [address] [phone]          [facility] @ [place]   [address] [phone]  [facility]  [address] [phone]     [facility])  [address]   [phone]     [facility]  Phone: [phone]  [facility]  Phone: [phone]  [facility] Phone: [phone]  [facility] Phone: [phone]  [facility] Phone: [phone]  [facility] Phone: [phone]  [facility]  Phone: [phone]  

## 2020-12-24 NOTE — Progress Notes (Signed)
Chart reviewed for nurse visit. Agree with plan of care.   Venora Maples, MD 12/24/20 5:01 PM

## 2021-02-15 NOTE — L&D Delivery Note (Signed)
OB/GYN Faculty Practice Delivery Note  Danielle Monroe is a 31 y.o. G2P1001 s/p VD at [redacted]w[redacted]d. She was admitted for spontaneous labor at 9cm upon arrival.   ROM: 0h 65m with clear fluid GBS Status: Positive/-- (06/16 1037), received one dose of Amp but inadequately treated  Maximum Maternal Temperature: Afebrile   Labor Progress: Initial SVE: 9cm. She then progressed to complete with expectant management.   Delivery Date/Time: 70 Delivery: Called to room and patient was complete and pushing after SROM with clear fluid. Head delivered L OA. No nuchal cord present. Shoulder and body delivered in usual fashion. Infant with spontaneous cry, placed on mother's abdomen, dried and stimulated. Cord clamped x 2 after 1-minute delay, and cut by FOB. Cord blood drawn. Placenta delivered spontaneously with gentle cord traction. Fundus firm with massage and Pitocin. Labia, perineum, vagina, and cervix inspected with a deep 2nd degree laceration. This was repaired with 3 deep layer interrupted sutures of 3.0 vicryl and then in usual fashion with 3.0 vicryl rapide. A rectal exam was performed prior to repair with intact rectal tissue and tone.  Baby Weight: pending  Placenta: 3 vessel, intact. Sent to L&D Complications: None Lacerations:  EBL: 250 mL Analgesia: Local lidocaine, IV fentanyl for repair   Infant:  APGAR (1 MIN): 9   APGAR (5 MINS): 9    Leticia Penna, DO  OB Family Medicine Fellow, [facility] for [facility], [facility] 08/21/2021, 8:07 AM

## 2021-04-08 ENCOUNTER — Ambulatory Visit (INDEPENDENT_AMBULATORY_CARE_PROVIDER_SITE_OTHER): Payer: Self-pay | Admitting: *Deleted

## 2021-04-08 ENCOUNTER — Other Ambulatory Visit: Payer: Self-pay

## 2021-04-08 ENCOUNTER — Encounter: Payer: Self-pay | Admitting: *Deleted

## 2021-04-08 ENCOUNTER — Other Ambulatory Visit (HOSPITAL_COMMUNITY)
Admission: RE | Admit: 2021-04-08 | Discharge: 2021-04-08 | Disposition: A | Discharge status: Home / Self Care | Payer: Medicaid Other | Source: Ambulatory Visit | Attending: Family Medicine | Admitting: Family Medicine

## 2021-04-08 VITALS — BP 114/76 | HR 97 | Ht 60.0 in | Wt 138.3 lb

## 2021-04-08 DIAGNOSIS — E538 Deficiency of other specified B group vitamins: Secondary | ICD-10-CM

## 2021-04-08 DIAGNOSIS — O0990 Supervision of high risk pregnancy, unspecified, unspecified trimester: Secondary | ICD-10-CM | POA: Insufficient documentation

## 2021-04-08 DIAGNOSIS — N883 Incompetence of cervix uteri: Secondary | ICD-10-CM

## 2021-04-08 DIAGNOSIS — E559 Vitamin D deficiency, unspecified: Secondary | ICD-10-CM

## 2021-04-08 DIAGNOSIS — N3000 Acute cystitis without hematuria: Secondary | ICD-10-CM

## 2021-04-08 DIAGNOSIS — Z789 Other specified health status: Secondary | ICD-10-CM

## 2021-04-08 DIAGNOSIS — O99891 Other specified diseases and conditions complicating pregnancy: Secondary | ICD-10-CM

## 2021-04-08 HISTORY — DX: Incompetence of cervix uteri: N88.3

## 2021-04-08 NOTE — Patient Instructions (Signed)
  At our [facility], we work as an integrated team, providing care to address both physical and emotional health. Your medical provider may refer you to see our Behavioral Health Clinician (BHC) on the same day you see your medical provider, as availability permits; often scheduled virtually at your convenience.  Our BHC is available to all patients, visits generally last between 20-30 minutes, but can be longer or shorter, depending on patient need. The BHC offers help with stress management, coping with symptoms of depression and anxiety, major life changes , sleep issues, changing risky behavior, grief and loss, life stress, working on personal life goals, and  behavioral health issues, as these all affect your overall health and wellness.  The BHC is NOT available for the following: FMLA paperwork, court-ordered evaluations, specialty assessments (custody or disability), letters to employers, or obtaining certification for an emotional support animal. The BHC does not provide long-term therapy. You have the right to refuse integrated behavioral health services, or to reschedule to see the BHC at a later date.  Confidentiality exception: If it is suspected that a child or disabled adult is being abused or neglected, we are required by law to report that to either Child Protective Services or Adult Protective Services.  If you have a diagnosis of Bipolar affective disorder, Schizophrenia, or recurrent Major depressive disorder, we will recommend that you establish care with a psychiatrist, as these are lifelong, chronic conditions, and we want your overall emotional health and medications to be more closely monitored. If you anticipate needing extended maternity leave due to mental health issues postpartum, it it recommended you inform your medical provider, so we can put in a referral to a psychiatrist as soon as possible. The BHC is unable to recommend an extended maternity leave for mental  health issues. Your medical provider or BHC may refer you to a therapist for ongoing, traditional therapy, or to a psychiatrist, for medication management, if it would benefit your overall health. Depending on your insurance, you may have a copay or be charged a deductible, depending on your insurance, to see the BHC. If you are uninsured, it is recommended that you apply for financial assistance. (Forms may be requested at the front desk for in-person visits, via MyChart, or request a form during a virtual visit).  If you see the BHC more than 6 times, you will have to complete a comprehensive clinical assessment interview with the BHC to resume integrated services.  For virtual visits with the BHC, you must be physically in the state of [place] at the time of the visit. For example, if you live in [place], you will have to do an in-person visit with the BHC, and your out-of-state insurance may not cover behavioral health services in [place]. If you are going out of the state or country for any reason, the BHC may see you virtually when you return to [place], but not while you are physically outside of [place].    

## 2021-04-08 NOTE — Progress Notes (Signed)
New OB Intake   I explained I am completing New OB Intake today. We discussed her EDD of 08/15/21 that is based on LMP of 11/08/20. She did have some prenatal care in [place] and brought records. Copy made and placed in chart. She has [place] 03/26/21 in [place] that reports baby measured [redacted]w[redacted]d. I explained per our dating protocol we would not change her EDD based on [place]; but they can discuss with provider at new ob visit.  Pt is G2/P1001. I reviewed her allergies, medications, Medical/Surgical/OB history, and appropriate screenings. She has list of medications/ vitamins she was taking from [place] that are not in our formulary I informed her of [facility] services. Based on history, this is a complicated  pregnancy  due to hx PTD, incompetent cervix.   Patient Active Problem List   Diagnosis Date Noted   Cervical incompetence 04/08/2021   Supervision of high risk pregnancy, antepartum 04/08/2021   Breast discharge 05/12/2020   Vitamin D deficiency 05/09/2020   Vitamin B12 deficiency 05/09/2020   Language barrier 04/13/2016    Concerns addressed today  Delivery Plans:  Plans to deliver at [facility]   MyChart/Babyscripts MyChart access verified. I explained pt will have some visits in office and some virtually.  Blood Pressure Cuff  Blood pressure cuff not ordered, Patient has Family planning Medicaid. Explained will need to change to Pregnancy Medicaid and then we can order cuff.    Weight scale: Patient does not  have weight scale. Weight scale will be ordered for patient to pick up from [facility] once she has pregnancy medicaid.  Anatomy US Explained first scheduled [place] will be asap. Called and scheduled for first available 04/20/21 at 0730. Pt notified to arrive at 0715.  Labs Discussed Avelina Laine genetic screening with patient. Would like both Panorama and Horizon drawn at new OB visit.Also informed patient she will need AFP 15-21 weeks to complete genetic testing .Routine prenatal labs drawn today  at intake.  Covid Vaccine Patient not asked if had covid vaccine.   Is patient a CenteringPregnancy candidate? Not a candidate   Is patient a Mom+Baby Combined Care candidate? Not a candidate     Informed patient of [facility] website  and placed link in her AVS.   Social Determinants of Health Food Insecurity: Patient denies food insecurity. WIC Referral: Patient is interested in referral to [facility].  Transportation: Patient denies transportation needs. Childcare: Discussed no children allowed at ultrasound appointments. Offered childcare services; patient declines childcare services at this time.  Send link to Pregnancy Navigators   Placed OB Box on problem list and updated  First visit review I reviewed new OB appt with pt. I explained she will have a pelvic exam, ob bloodwork with genetic screening, and PAP smear.Patient had new ob scheduled with Midwife. Appointment changed to MD for 04/09/21. Patient in agreement with plan.  Explained pt will be seen by Dr. Shawnie Pons at first visit; encounter routed to appropriate provider. Explained that patient will be seen by pregnancy navigator following visit with provider. [facility] information placed in AVS.   Nancy Fetter 04/08/2021  4:56 PM

## 2021-04-08 NOTE — Progress Notes (Signed)
Patient seen and assessed by nursing staff.  Agree with documentation and plan.  

## 2021-04-09 ENCOUNTER — Encounter: Payer: Self-pay | Admitting: Family Medicine

## 2021-04-09 ENCOUNTER — Ambulatory Visit (INDEPENDENT_AMBULATORY_CARE_PROVIDER_SITE_OTHER): Payer: Self-pay | Admitting: Family Medicine

## 2021-04-09 VITALS — BP 112/79 | HR 103 | Wt 137.9 lb

## 2021-04-09 DIAGNOSIS — O0990 Supervision of high risk pregnancy, unspecified, unspecified trimester: Secondary | ICD-10-CM

## 2021-04-09 DIAGNOSIS — Z789 Other specified health status: Secondary | ICD-10-CM

## 2021-04-09 DIAGNOSIS — E559 Vitamin D deficiency, unspecified: Secondary | ICD-10-CM | POA: Diagnosis not present

## 2021-04-09 DIAGNOSIS — E538 Deficiency of other specified B group vitamins: Secondary | ICD-10-CM

## 2021-04-09 DIAGNOSIS — Z31430 Encounter of female for testing for genetic disease carrier status for procreative management: Secondary | ICD-10-CM | POA: Diagnosis not present

## 2021-04-09 DIAGNOSIS — N883 Incompetence of cervix uteri: Secondary | ICD-10-CM

## 2021-04-09 DIAGNOSIS — O09892 Supervision of other high risk pregnancies, second trimester: Secondary | ICD-10-CM | POA: Diagnosis not present

## 2021-04-09 LAB — CBC/D/PLT+RPR+RH+ABO+RUBIGG...
Antibody Screen: NEGATIVE
Basophils Absolute: 0 10*3/uL (ref 0.0–0.2)
Basos: 0 %
EOS (ABSOLUTE): 0.1 10*3/uL (ref 0.0–0.4)
Eos: 1 %
HCV Ab: NONREACTIVE
HIV Screen 4th Generation wRfx: NONREACTIVE
Hematocrit: 33.7 % — ABNORMAL LOW (ref 34.0–46.6)
Hemoglobin: 11.3 g/dL (ref 11.1–15.9)
Hepatitis B Surface Ag: NEGATIVE
Immature Grans (Abs): 0.1 10*3/uL (ref 0.0–0.1)
Immature Granulocytes: 1 %
Lymphocytes Absolute: 2.2 10*3/uL (ref 0.7–3.1)
Lymphs: 28 %
MCH: 28.3 pg (ref 26.6–33.0)
MCHC: 33.5 g/dL (ref 31.5–35.7)
MCV: 84 fL (ref 79–97)
Monocytes Absolute: 0.5 10*3/uL (ref 0.1–0.9)
Monocytes: 6 %
Neutrophils Absolute: 5 10*3/uL (ref 1.4–7.0)
Neutrophils: 64 %
Platelets: 283 10*3/uL (ref 150–450)
RBC: 4 x10E6/uL (ref 3.77–5.28)
RDW: 13.5 % (ref 11.7–15.4)
RPR Ser Ql: NONREACTIVE
Rh Factor: POSITIVE
Rubella Antibodies, IGG: 2.56 index (ref >0.99)
WBC: 7.9 10*3/uL (ref 3.4–10.8)

## 2021-04-09 LAB — CERVICOVAGINAL ANCILLARY ONLY
Chlamydia: NEGATIVE
Comment: NEGATIVE
Comment: NORMAL
Neisseria Gonorrhea: NEGATIVE

## 2021-04-09 LAB — HCV INTERPRETATION

## 2021-04-09 LAB — HEMOGLOBIN A1C
Est. average glucose Bld gHb Est-mCnc: 114 mg/dL
Hgb A1c MFr Bld: 5.6 % (ref 4.8–5.6)

## 2021-04-09 MED ORDER — PRENATAL 27-0.8 MG PO TABS: 1.0000 | ORAL_TABLET | Freq: Every day | ORAL | 0 refills | Status: DC

## 2021-04-09 NOTE — Progress Notes (Signed)
Pt currently has a Cerclage, Dr.in [place] suggested that she get one due to travel but she's not sure if its necessary to keep.

## 2021-04-09 NOTE — Patient Instructions (Signed)

## 2021-04-09 NOTE — Progress Notes (Signed)
° °  INITIAL PRENATAL VISIT NOTE  Subjective:  Danielle Monroe is a 31 y.o. G2P1001 at [redacted]w[redacted]d being seen today for transferring prenatal care, begun in [place]. 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 She has normal cervical length in [place] this pregnancy at 3.2 cm.  She is currently monitored for the following issues for this low-risk pregnancy and has Language barrier; Vitamin D deficiency; Vitamin B12 deficiency; Breast discharge; Cervical incompetence; and Supervision of high risk pregnancy, antepartum on their problem list.  Patient reports no complaints.  Contractions: Not present. Vag. Bleeding: None.  Movement: Present. Denies leaking of fluid.   The following portions of the patient's history were reviewed and updated as appropriate: allergies, current medications, past family history, past medical history, past social history, past surgical history and problem list.   Objective:   Vitals:   04/09/21 1017  BP: 112/79  Pulse: (!) 103  Weight: 137 lb 14.4 oz (62.6 kg)    Fetal Status: Fetal Heart Rate (bpm): 156 Fundal Height: 21 cm Movement: Present     General:  Alert, oriented and cooperative. Patient is in no acute distress.  Skin: Skin is warm and dry. No rash noted.   Cardiovascular: Normal heart rate noted  Respiratory: Normal respiratory effort, no problems with respiration noted  Abdomen: Soft, gravid, appropriate for gestational age.  Pain/Pressure: Present     Pelvic: Cervical exam deferred        Extremities: Normal range of motion.  Edema: Trace  Mental Status: Normal mood and affect. Normal behavior. Normal judgment and thought content.   Assessment and Plan:  Pregnancy: G2P1001 at [redacted]w[redacted]d 1. Supervision of high risk pregnancy, antepartum - Genetic Screening - Prenatal Vit-Fe Fumarate-FA (MULTIVITAMIN-PRENATAL) 27-0.8 MG TABS tablet; Take 1 tablet by mouth daily at 12 noon.  Dispense: 30 tablet; Refill:  0  2. Language barrier Speaks good English  3. Cervical incompetence Apparently not true--may f/u cervical length at next u/s in early March  4. Vitamin D deficiency Repeat levels and replete prn - VITAMIN D 25 Hydroxy (Vit-D Deficiency, Fractures)  5. Vitamin B12 deficiency Repeat levels and replete prn - B12 and Folate Panel General obstetric precautions including but not limited to vaginal bleeding, contractions, leaking of fluid and fetal movement were reviewed in detail with the patient. Please refer to After Visit Summary for other counseling recommendations.   Return in 4 weeks (on 05/07/2021) for [facility].  Future Appointments  Date Time Provider Department Center  04/20/2021  7:15 AM [facility] NURSE [facility]  04/20/2021  7:30 AM [facility] US3 [facility]  05/07/2021  8:55 AM Marylene Land, CNM [facility]  05/11/2021  1:00 PM Mliss Sax, MD [facility]    Reva Bores, MD

## 2021-04-10 LAB — VITAMIN D 25 HYDROXY (VIT D DEFICIENCY, FRACTURES): Vit D, 25-Hydroxy: 39.9 ng/mL (ref 30.0–100.0)

## 2021-04-10 LAB — CULTURE, OB URINE

## 2021-04-10 LAB — B12 AND FOLATE PANEL
Folate: >20 ng/mL (ref >3.0)
Vitamin B-12: 374 pg/mL (ref 232–1245)

## 2021-04-10 LAB — URINE CULTURE, OB REFLEX

## 2021-04-11 DIAGNOSIS — R8271 Bacteriuria: Secondary | ICD-10-CM | POA: Insufficient documentation

## 2021-04-11 DIAGNOSIS — O99891 Other specified diseases and conditions complicating pregnancy: Secondary | ICD-10-CM | POA: Insufficient documentation

## 2021-04-11 MED ORDER — SULFAMETHOXAZOLE-TRIMETHOPRIM 800-160 MG PO TABS
1.0000 | ORAL_TABLET | Freq: Two times a day (BID) | ORAL | 0 refills | Status: AC
Start: 2021-04-11 — End: 2021-04-18

## 2021-04-11 NOTE — Addendum Note (Signed)
Addended by: Reva Bores on: 04/11/2021 02:23 PM   Modules accepted: Orders

## 2021-04-19 ENCOUNTER — Other Ambulatory Visit: Payer: Self-pay | Admitting: Family Medicine

## 2021-04-19 DIAGNOSIS — O0990 Supervision of high risk pregnancy, unspecified, unspecified trimester: Secondary | ICD-10-CM

## 2021-04-19 DIAGNOSIS — N883 Incompetence of cervix uteri: Secondary | ICD-10-CM

## 2021-04-20 ENCOUNTER — Encounter: Payer: Medicaid Other | Admitting: Advanced Practice Midwife

## 2021-04-20 ENCOUNTER — Other Ambulatory Visit: Payer: Self-pay | Admitting: *Deleted

## 2021-04-20 ENCOUNTER — Encounter: Payer: Self-pay | Admitting: *Deleted

## 2021-04-20 ENCOUNTER — Other Ambulatory Visit: Payer: Self-pay

## 2021-04-20 ENCOUNTER — Ambulatory Visit: Payer: Medicaid Other | Admitting: *Deleted

## 2021-04-20 ENCOUNTER — Ambulatory Visit: Payer: Medicaid Other | Attending: Family Medicine

## 2021-04-20 VITALS — BP 112/66 | HR 100

## 2021-04-20 DIAGNOSIS — Z3492 Encounter for supervision of normal pregnancy, unspecified, second trimester: Secondary | ICD-10-CM

## 2021-04-20 DIAGNOSIS — O99891 Other specified diseases and conditions complicating pregnancy: Secondary | ICD-10-CM

## 2021-04-20 DIAGNOSIS — O0990 Supervision of high risk pregnancy, unspecified, unspecified trimester: Secondary | ICD-10-CM | POA: Diagnosis not present

## 2021-04-20 DIAGNOSIS — R8271 Bacteriuria: Secondary | ICD-10-CM | POA: Insufficient documentation

## 2021-04-20 DIAGNOSIS — N883 Incompetence of cervix uteri: Secondary | ICD-10-CM | POA: Diagnosis not present

## 2021-05-07 ENCOUNTER — Ambulatory Visit (INDEPENDENT_AMBULATORY_CARE_PROVIDER_SITE_OTHER): Payer: Medicaid Other | Admitting: Student

## 2021-05-07 ENCOUNTER — Other Ambulatory Visit: Payer: Self-pay

## 2021-05-07 VITALS — BP 112/80 | HR 101 | Wt 142.6 lb

## 2021-05-07 DIAGNOSIS — Z3A25 25 weeks gestation of pregnancy: Secondary | ICD-10-CM

## 2021-05-07 DIAGNOSIS — N883 Incompetence of cervix uteri: Secondary | ICD-10-CM | POA: Diagnosis not present

## 2021-05-07 DIAGNOSIS — O99891 Other specified diseases and conditions complicating pregnancy: Secondary | ICD-10-CM

## 2021-05-07 DIAGNOSIS — O0990 Supervision of high risk pregnancy, unspecified, unspecified trimester: Secondary | ICD-10-CM

## 2021-05-07 DIAGNOSIS — R8271 Bacteriuria: Secondary | ICD-10-CM

## 2021-05-07 MED ORDER — PRENATAL 27-0.8 MG PO TABS: 1.0000 | ORAL_TABLET | Freq: Every day | ORAL | 6 refills | Status: DC

## 2021-05-07 MED ORDER — BLOOD PRESSURE KIT DEVI: 1.0000 | Freq: Once | 0 refills | Status: AC

## 2021-05-07 MED ORDER — GOJJI WEIGHT SCALE MISC: 1.0000 | Freq: Once | 0 refills | Status: AC

## 2021-05-07 NOTE — Patient Instructions (Addendum)
Oral Glucose Tolerance Test During Pregnancy ?Why am I having this test? ?The oral glucose tolerance test (OGTT) is done to check how your body processes blood sugar (glucose). This is one of several tests used to diagnose diabetes that develops during pregnancy (gestational diabetes mellitus). Gestational diabetes is a short-term form of diabetes that some women develop while they are pregnant. It usually occurs during the second trimester of pregnancy and goes away after delivery. ?Testing, or screening, for gestational diabetes usually occurs at weeks 24-28 of pregnancy. You may have the OGTT test after having a 1-hour glucose screening test if the results from that test indicate that you may have gestational diabetes. This test may also be needed if: ?You have a history of gestational diabetes. ?There is a history of giving birth to very large babies or of losing pregnancies (having stillbirths). ?You have signs and symptoms of diabetes, such as: ?Changes in your eyesight. ?Tingling or numbness in your hands or feet. ?Changes in hunger, thirst, and urination, and these are not explained by your pregnancy. ?What is being tested? ?This test measures the amount of glucose in your blood at different times during a period of 3 hours. This shows how well your body can process glucose. ?What kind of sample is taken? ?Blood samples are required for this test. They are usually collected by inserting a needle into a blood vessel. ?How do I prepare for this test? ?For 3 days before your test, eat normally. Have plenty of carbohydrate-rich foods. ?Follow instructions from your health care provider about: ?Eating or drinking restrictions on the day of the test. You may be asked not to eat or drink anything other than water (to fast) starting 8-10 hours before the test. ?Changing or stopping your regular medicines. Some medicines may interfere with this test. ?Tell a health care provider about: ?All medicines you are taking,  including vitamins, herbs, eye drops, creams, and over-the-counter medicines. ?Any blood disorders you have. ?Any surgeries you have had. ?Any medical conditions you have. ?What happens during the test? ?First, your blood glucose will be measured. This is referred to as your fasting blood glucose because you fasted before the test. Then, you will drink a glucose solution that contains a certain amount of glucose. Your blood glucose will be measured again 1, 2, and 3 hours after you drink the solution. ?This test takes about 3 hours to complete. You will need to stay at the testing location during this time. During the testing period: ?Do not eat or drink anything other than the glucose solution. ?Do not exercise. ?Do not use any products that contain nicotine or tobacco, such as cigarettes, e-cigarettes, and chewing tobacco. These can affect your test results. If you need help quitting, ask your health care provider. ?The testing procedure may vary among health care providers and hospitals. ?How are the results reported? ?Your results will be reported as milligrams of glucose per deciliter of blood (mg/dL) or millimoles per liter (mmol/L). There is more than one source for screening and diagnosis reference values used to diagnose gestational diabetes. Your health care provider will compare your results to normal values that were established after testing a large group of people (reference values). Reference values may vary among labs and hospitals. For this test (Carpenter-Coustan), reference values are: ?Fasting: 95 mg/dL (5.3 mmol/L). ?1 hour: 180 mg/dL (10.0 mmol/L). ?2 hour: 155 mg/dL (8.6 mmol/L). ?3 hour: 140 mg/dL (7.8 mmol/L). ?What do the results mean? ?Results below the reference values are considered  normal. If two or more of your blood glucose levels are at or above the reference values, you may be diagnosed with gestational diabetes. If only one level is high, your health care provider may suggest  repeat testing or other tests to confirm a diagnosis. ?Talk with your health care provider about what your results mean. ?Questions to ask your health care provider ?Ask your health care provider, or the department that is doing the test: ?When will my results be ready? ?How will I get my results? ?What are my treatment options? ?What other tests do I need? ?What are my next steps? ?Summary ?The oral glucose tolerance test (OGTT) is one of several tests used to diagnose diabetes that develops during pregnancy (gestational diabetes mellitus). Gestational diabetes is a short-term form of diabetes that some women develop while they are pregnant. ?You may have the OGTT test after having a 1-hour glucose screening test if the results from that test show that you may have gestational diabetes. You may also have this test if you have any symptoms or risk factors for this type of diabetes. ?Talk with your health care provider about what your results mean. ?This information is not intended to replace advice given to you by your health care provider. Make sure you discuss any questions you have with your health care provider. ?Document Revised: 07/12/2019 Document Reviewed: 07/12/2019 ?Elsevier Patient Education ? 2022 Elsevier Inc. ? ? ? ? ? ? ? ? ? ? ? ?[facility] ?[address], [place], [place] [address] ?[phone] ?Hours: ?Sunday Closed ?Monday 9AM-6PM ?Tuesday 9AM-6PM ?Wednesday 9AM-6PM ?Thursday 9AM-6PM ?Friday           9AM-6PM ?Saturday         10 AM-1PM ? ?Safe Medications in Pregnancy  ? ?Acne:  ?Benzoyl Peroxide  ?Salicylic Acid  ? ?Backache/Headache:  ?Tylenol: 2 regular strength every 4 hours OR  ?             2 Extra strength every 6 hours  ? ?Colds/Coughs/Allergies:  ?Benadryl (alcohol free) 25 mg every 6 hours as needed  ?Breath right strips  ?Claritin  ?Cepacol throat lozenges  ?Chloraseptic throat spray  ?Cold-Eeze- up to three times per day  ?Cough drops, alcohol free  ?Flonase (by prescription only)   ?Guaifenesin  ?Mucinex  ?Robitussin DM (plain only, alcohol free)  ?Saline nasal spray/drops  ?Sudafed (pseudoephedrine) & Actifed * use only after [redacted] weeks gestation and if you do not have high blood pressure  ?Tylenol  ?Vicks Vaporub  ?Zinc lozenges  ?Zyrtec  ? ?Constipation:  ?Colace  ?Ducolax suppositories  ?Fleet enema  ?Glycerin suppositories  ?Metamucil  ?Milk of magnesia  ?Miralax  ?Senokot  ?Smooth move tea  ? ?Diarrhea:  ?Kaopectate  ?Imodium A-D  ? ?*NO pepto Bismol  ? ?Hemorrhoids:  ?Anusol  ?Anusol HC  ?Preparation H  ?Tucks  ? ?Indigestion:  ?Tums  ?Maalox  ?Mylanta  ?Zantac  ?Pepcid  ? ?Insomnia:  ?Benadryl (alcohol free) 25mg  every 6 hours as needed  ?Tylenol PM  ?Unisom, no Gelcaps  ? ?Leg Cramps:  ?Tums  ?MagGel  ? ?Nausea/Vomiting:  ?Bonine  ?Dramamine  ?Emetrol  ?Ginger extract  ?Sea bands  ?Meclizine  ?Nausea medication to take during pregnancy:  ?Unisom (doxylamine succinate 25 mg tablets) Take one tablet daily at bedtime. If symptoms are not adequately controlled, the dose can be increased to a maximum recommended dose of two tablets daily (1/2 tablet in the morning, 1/2 tablet mid-afternoon and one at bedtime).  ?  Vitamin B6 100mg  tablets. Take one tablet twice a day (up to 200 mg per day).  ? ?Skin Rashes:  ?Aveeno products  ?Benadryl cream or 25mg  every 6 hours as needed  ?Calamine Lotion  ?1% cortisone cream  ? ?Yeast infection:  ?Gyne-lotrimin 7  ?Monistat 7  ? ? ?**If taking multiple medications, please check labels to avoid duplicating the same active ingredients  ?**take medication as directed on the label  ?** Do not exceed 4000 mg of tylenol in 24 hours  ?**Do not take medications that contain aspirin or ibuprofen  ?    ?   ? ? ?

## 2021-05-07 NOTE — Progress Notes (Signed)
? ?  PRENATAL VISIT NOTE ? ?Subjective:  ?Danielle Monroe is a 31 y.o. G2P1001 at 95w5dbeing seen today for ongoing prenatal care.  She is currently monitored for the following issues for this low-risk pregnancy and has Language barrier; Vitamin D deficiency; Vitamin B12 deficiency; Breast discharge; Cervical incompetence; Supervision of high risk pregnancy, antepartum; and Asymptomatic bacteriuria during pregnancy in second trimester on their problem list. ? ?Patient reports no complaints.  Contractions: Irritability. Vag. Bleeding: None.  Movement: Present. Denies leaking of fluid.  ? ?The following portions of the patient's history were reviewed and updated as appropriate: allergies, current medications, past family history, past medical history, past social history, past surgical history and problem list.  ? ?Objective:  ? ?Vitals:  ? 05/07/21 0917  ?BP: 112/80  ?Pulse: (!) 101  ?Weight: 142 lb 9.6 oz (64.7 kg)  ? ? ?Fetal Status: Fetal Heart Rate (bpm): 150 Fundal Height: 25 cm Movement: Present    ? ?General:  Alert, oriented and cooperative. Patient is in no acute distress.  ?Skin: Skin is warm and dry. No rash noted.   ?Cardiovascular: Normal heart rate noted  ?Respiratory: Normal respiratory effort, no problems with respiration noted  ?Abdomen: Soft, gravid, appropriate for gestational age.  Pain/Pressure: Present     ?Pelvic: Cervical exam deferred        ?Extremities: Normal range of motion.  Edema: None  ?Mental Status: Normal mood and affect. Normal behavior. Normal judgment and thought content.  ? ?Assessment and Plan:  ?Pregnancy: G2P1001 at 223w5d1. Supervision of high risk pregnancy, antepartum ? ?- Prenatal Vit-Fe Fumarate-FA (MULTIVITAMIN-PRENATAL) 27-0.8 MG TABS tablet; Take 1 tablet by mouth daily at 12 noon.  Dispense: 30 tablet; Refill: 6 ?- Blood Pressure Monitoring (BLOOD PRESSURE KIT) DEVI; 1 Device by Does not apply route once for 1 dose.  Dispense: 1 each; Refill: 0 ?- Misc. Devices  (GOJJI WEIGHT SCALE) MISC; 1 Device by Does not apply route once for 1 dose.  Dispense: 1 each; Refill: 0 ?- CHL AMB BABYSCRIPTS SCHEDULE OPTIMIZATION ? ?2. Cervical incompetence ?-had cervical incompetence in last pregnancy; cervical length has been reassuring so far in pregnancy ? ?3. Asymptomatic bacteriuria during pregnancy in second trimester ?-she has completed her abx; will do test of cure at next visit; she denies any symptoms ? ?Preterm labor symptoms and general obstetric precautions including but not limited to vaginal bleeding, contractions, leaking of fluid and fetal movement were reviewed in detail with the patient. ?Please refer to After Visit Summary for other counseling recommendations.  ? ?Return in about 4 years (around 05/07/2025), or LROB and 2 hour GTT. ? ?Future Appointments  ?Date Time Provider [place]?05/11/2021  1:00 PM KrLibby MawMD [facility]  ?05/18/2021 10:15 AM [facility] NURSE [facility]  ?05/18/2021 10:30 AM [facility] US2 [facility]  ? ? ?[place] ? ?

## 2021-05-11 ENCOUNTER — Encounter: Payer: Self-pay | Admitting: Family Medicine

## 2021-05-11 ENCOUNTER — Ambulatory Visit (INDEPENDENT_AMBULATORY_CARE_PROVIDER_SITE_OTHER): Payer: Medicaid Other | Admitting: Family Medicine

## 2021-05-11 VITALS — BP 100/70 | HR 105 | Temp 97.0°F | Ht 60.0 in | Wt 143.2 lb

## 2021-05-11 DIAGNOSIS — K219 Gastro-esophageal reflux disease without esophagitis: Secondary | ICD-10-CM | POA: Diagnosis not present

## 2021-05-11 MED ORDER — SUCRALFATE 1 G PO TABS: ORAL_TABLET | ORAL | 2 refills | Status: DC

## 2021-05-11 NOTE — Progress Notes (Signed)
? ?Established Patient Office Visit ? ?Subjective:  ?Patient ID: Danielle Monroe, female    DOB: 1990/09/09  Age: 31 y.o. MRN: [id number] ? ?CC:  ?Chief Complaint  ?Patient presents with  ? Transitions Of Care  ?  TOF. Est care. CPE. Indigestion at night. Not fasting.  ? ? ?HPI ?Danielle Monroe presents for a 2 to 3-day history of burning in the chest at night when she lays down for bed.  Symptoms are relieved by Tums.  They return in the morning.G2P1001 at approximately [redacted] weeks pregnant with an [facility] of July 10.  She experienced this later in her first pregnancy.  Denies nausea vomiting, chest pain shortness of breath ? ?Past Medical History:  ?Diagnosis Date  ? DUB (dysfunctional uterine bleeding) 04/2020  ? Medical history non-contributory   ? Vitamin B12 deficiency   ? Vitamin D deficiency   ? ? ?Past Surgical History:  ?Procedure Laterality Date  ? CERVICAL CERCLAGE  02/2016  ? ? ?Family History  ?Problem Relation Age of Onset  ? Hypertension Father   ? ? ?Social History  ? ?Socioeconomic History  ? Marital status: Married  ?  Spouse name: Not on file  ? Number of children: Not on file  ? Years of education: Not on file  ? Highest education level: Not on file  ?Occupational History  ? Not on file  ?Tobacco Use  ? Smoking status: Never  ? Smokeless tobacco: Never  ?Vaping Use  ? Vaping Use: Never used  ?Substance and Sexual Activity  ? Alcohol use: Never  ? Drug use: Never  ? Sexual activity: Yes  ?  Birth control/protection: None  ?Other Topics Concern  ? Not on file  ?Social History Narrative  ? ** Merged History Encounter **  ?    ? ?Social Determinants of Health  ? ?Financial Resource Strain: Not on file  ?Food Insecurity: No Food Insecurity  ? Worried About Programme researcher, broadcasting/film/video in the Last Year: Never true  ? Ran Out of Food in the Last Year: Never true  ?Transportation Needs: No Transportation Needs  ? Lack of Transportation (Medical): No  ? Lack of Transportation (Non-Medical): No  ?Physical  Activity: Not on file  ?Stress: Not on file  ?Social Connections: Not on file  ?Intimate Partner Violence: Not At Risk  ? Fear of Current or Ex-Partner: No  ? Emotionally Abused: No  ? Physically Abused: No  ? Sexually Abused: No  ? ? ?Outpatient Medications Prior to Visit  ?Medication Sig Dispense Refill  ? aspirin EC 81 MG tablet Take 81 mg by mouth daily. Swallow whole.    ? Prenatal Vit-Fe Fumarate-FA (MULTIVITAMIN-PRENATAL) 27-0.8 MG TABS tablet Take 1 tablet by mouth daily at 12 noon. 30 tablet 6  ? ?No facility-administered medications prior to visit.  ? ? ?No Known Allergies ? ?ROS ?Review of Systems  ?Constitutional:  Positive for fatigue. Negative for chills, diaphoresis, fever and unexpected weight change.  ?Respiratory:  Negative for chest tightness and shortness of breath.   ?Cardiovascular:  Negative for chest pain and palpitations.  ?Gastrointestinal:  Negative for nausea and vomiting.  ?Genitourinary: Negative.   ?Musculoskeletal:  Negative for gait problem and joint swelling.  ? ?  ?Objective:  ?  ?Physical Exam ?Vitals and nursing note reviewed.  ?Constitutional:   ?   General: She is not in acute distress. ?   Appearance: Normal appearance. She is not ill-appearing, toxic-appearing or diaphoretic.  ?HENT:  ?  Head: Normocephalic and atraumatic.  ?   Right Ear: External ear normal.  ?   Left Ear: External ear normal.  ?   Mouth/Throat:  ?   Mouth: Mucous membranes are moist.  ?   Pharynx: Oropharynx is clear. No oropharyngeal exudate or posterior oropharyngeal erythema.  ?Eyes:  ?   General: No scleral icterus.    ?   Right eye: No discharge.     ?   Left eye: No discharge.  ?   Conjunctiva/sclera: Conjunctivae normal.  ?   Pupils: Pupils are equal, round, and reactive to light.  ?Cardiovascular:  ?   Rate and Rhythm: Normal rate and regular rhythm.  ?Pulmonary:  ?   Effort: Pulmonary effort is normal.  ?   Breath sounds: Normal breath sounds.  ?Musculoskeletal:  ?   Cervical back: No rigidity or  tenderness.  ?   Right lower leg: No edema.  ?   Left lower leg: No edema.  ?Lymphadenopathy:  ?   Cervical: No cervical adenopathy.  ?Neurological:  ?   Mental Status: She is alert and oriented to person, place, and time.  ?Psychiatric:     ?   Mood and Affect: Mood normal.     ?   Behavior: Behavior normal.  ? ? ?BP 100/70 (BP Location: Left Arm, Patient Position: Sitting, Cuff Size: Normal)   Pulse (!) 105   Temp (!) 97 ?F (36.1 ?C) (Temporal)   Ht 5' (1.524 m)   Wt 143 lb 3.2 oz (65 kg)   LMP 11/08/2020   SpO2 97%   BMI 27.97 kg/m?  ?Wt Readings from Last 3 Encounters:  ?05/11/21 143 lb 3.2 oz (65 kg)  ?05/07/21 142 lb 9.6 oz (64.7 kg)  ?04/09/21 137 lb 14.4 oz (62.6 kg)  ? ? ? ?There are no preventive care reminders to display for this patient. ? ? ?There are no preventive care reminders to display for this patient. ? ?Lab Results  ?Component Value Date  ? TSH 1.78 05/09/2020  ? ?Lab Results  ?Component Value Date  ? WBC 7.9 04/08/2021  ? HGB 11.3 04/08/2021  ? HCT 33.7 (L) 04/08/2021  ? MCV 84 04/08/2021  ? PLT 283 04/08/2021  ? ?Lab Results  ?Component Value Date  ? NA 138 05/09/2020  ? K 4.3 05/09/2020  ? CO2 27 05/09/2020  ? GLUCOSE 94 05/09/2020  ? BUN 6 05/09/2020  ? CREATININE 0.61 05/09/2020  ? AST 14 05/09/2020  ? ALT 17 05/09/2020  ? CALCIUM 9.4 05/09/2020  ? GFR 118.76 05/09/2020  ? ?Lab Results  ?Component Value Date  ? CHOL 205 (H) 05/09/2020  ? ?Lab Results  ?Component Value Date  ? HDL 46.60 05/09/2020  ? ?Lab Results  ?Component Value Date  ? LDLCALC 137 (H) 05/09/2020  ? ?Lab Results  ?Component Value Date  ? TRIG 110.0 05/09/2020  ? ?Lab Results  ?Component Value Date  ? CHOLHDL 4 05/09/2020  ? ?Lab Results  ?Component Value Date  ? HGBA1C 5.6 04/08/2021  ? ? ?  ?Assessment & Plan:  ? ?Problem List Items Addressed This Visit   ?None ?Visit Diagnoses   ? ? Gastroesophageal reflux disease, unspecified whether esophagitis present    -  Primary  ? Relevant Medications  ? sucralfate  (CARAFATE) 1 g tablet  ? ?  ? ? ?Meds ordered this encounter  ?Medications  ? sucralfate (CARAFATE) 1 g tablet  ?  Sig: May take one tablet every 6 hours as  needed for reflux.  ?  Dispense:  30 tablet  ?  Refill:  2  ?  Please also show patient gaviscon. Patient is pregnant.  ? ? ?Follow-up: No follow-ups on file.  ? ?Also discuss with OB at next visit.  Patient was given information on sucralfate, esophageal reflux and Gaviscon.  Discussed lifestyle changes as well ?Mliss Sax, MD ?

## 2021-05-18 ENCOUNTER — Other Ambulatory Visit: Payer: Self-pay | Admitting: *Deleted

## 2021-05-18 ENCOUNTER — Ambulatory Visit: Payer: Medicaid Other | Attending: Obstetrics and Gynecology

## 2021-05-18 ENCOUNTER — Ambulatory Visit: Payer: Medicaid Other | Admitting: *Deleted

## 2021-05-18 ENCOUNTER — Encounter: Payer: Self-pay | Admitting: *Deleted

## 2021-05-18 VITALS — BP 111/72 | HR 96

## 2021-05-18 DIAGNOSIS — R8271 Bacteriuria: Secondary | ICD-10-CM | POA: Insufficient documentation

## 2021-05-18 DIAGNOSIS — Z3A26 26 weeks gestation of pregnancy: Secondary | ICD-10-CM | POA: Diagnosis not present

## 2021-05-18 DIAGNOSIS — O358XX0 Maternal care for other (suspected) fetal abnormality and damage, not applicable or unspecified: Secondary | ICD-10-CM | POA: Insufficient documentation

## 2021-05-18 DIAGNOSIS — Z3689 Encounter for other specified antenatal screening: Secondary | ICD-10-CM

## 2021-05-18 DIAGNOSIS — Z3687 Encounter for antenatal screening for uncertain dates: Secondary | ICD-10-CM | POA: Insufficient documentation

## 2021-05-18 DIAGNOSIS — O359XX0 Maternal care for (suspected) fetal abnormality and damage, unspecified, not applicable or unspecified: Secondary | ICD-10-CM

## 2021-05-18 DIAGNOSIS — Z3492 Encounter for supervision of normal pregnancy, unspecified, second trimester: Secondary | ICD-10-CM | POA: Insufficient documentation

## 2021-05-18 DIAGNOSIS — O99891 Other specified diseases and conditions complicating pregnancy: Secondary | ICD-10-CM | POA: Insufficient documentation

## 2021-05-18 DIAGNOSIS — O321XX0 Maternal care for breech presentation, not applicable or unspecified: Secondary | ICD-10-CM | POA: Diagnosis not present

## 2021-05-18 DIAGNOSIS — O0990 Supervision of high risk pregnancy, unspecified, unspecified trimester: Secondary | ICD-10-CM

## 2021-05-18 DIAGNOSIS — N883 Incompetence of cervix uteri: Secondary | ICD-10-CM

## 2021-05-18 NOTE — Progress Notes (Unsigned)
[place] [id number] ? ? ?

## 2021-05-28 ENCOUNTER — Encounter: Payer: Medicaid Other | Admitting: Student

## 2021-06-01 ENCOUNTER — Other Ambulatory Visit: Payer: Self-pay

## 2021-06-01 DIAGNOSIS — O0990 Supervision of high risk pregnancy, unspecified, unspecified trimester: Secondary | ICD-10-CM

## 2021-06-05 ENCOUNTER — Other Ambulatory Visit: Payer: Medicaid Other

## 2021-06-05 ENCOUNTER — Ambulatory Visit (INDEPENDENT_AMBULATORY_CARE_PROVIDER_SITE_OTHER): Payer: Medicaid Other | Admitting: Obstetrics and Gynecology

## 2021-06-05 ENCOUNTER — Encounter: Payer: Self-pay | Admitting: Obstetrics and Gynecology

## 2021-06-05 VITALS — BP 119/86 | HR 104 | Wt 145.0 lb

## 2021-06-05 DIAGNOSIS — Z3A29 29 weeks gestation of pregnancy: Secondary | ICD-10-CM

## 2021-06-05 DIAGNOSIS — O0990 Supervision of high risk pregnancy, unspecified, unspecified trimester: Secondary | ICD-10-CM

## 2021-06-05 DIAGNOSIS — K219 Gastro-esophageal reflux disease without esophagitis: Secondary | ICD-10-CM

## 2021-06-05 DIAGNOSIS — O99891 Other specified diseases and conditions complicating pregnancy: Secondary | ICD-10-CM

## 2021-06-05 DIAGNOSIS — N6452 Nipple discharge: Secondary | ICD-10-CM

## 2021-06-05 DIAGNOSIS — N883 Incompetence of cervix uteri: Secondary | ICD-10-CM

## 2021-06-05 DIAGNOSIS — R8271 Bacteriuria: Secondary | ICD-10-CM

## 2021-06-05 MED ORDER — FAMOTIDINE 20 MG PO TABS: 20.0000 mg | ORAL_TABLET | Freq: Two times a day (BID) | ORAL | 3 refills | Status: DC

## 2021-06-05 NOTE — Progress Notes (Signed)
? ? ? ?  PRENATAL VISIT NOTE ? ?Subjective:  ?Danielle Monroe is a 31 y.o. G2P1001 at [redacted]w[redacted]d being seen today for ongoing prenatal care.  She is currently monitored for the following issues for this low-risk pregnancy and has Language barrier; Cervical incompetence; Supervision of high risk pregnancy, antepartum; and Asymptomatic bacteriuria during pregnancy in second trimester on their problem list. ? ?Patient reports  darkening around neck, GERD .  Contractions: Irritability. Vag. Bleeding: None.  Movement: Present. Denies leaking of fluid.  ? ?The following portions of the patient's history were reviewed and updated as appropriate: allergies, current medications, past family history, past medical history, past social history, past surgical history and problem list.  ? ?Objective:  ? ?Vitals:  ? 06/05/21 0855  ?BP: 119/86  ?Pulse: (!) 104  ?Weight: 145 lb (65.8 kg)  ? ? ?Fetal Status: Fetal Heart Rate (bpm): 145   Movement: Present    ? ?General:  Alert, oriented and cooperative. Patient is in no acute distress.  ?Skin: Skin is warm and dry. No rash noted.   ?Cardiovascular: Normal heart rate noted  ?Respiratory: Normal respiratory effort, no problems with respiration noted  ?Abdomen: Soft, gravid, appropriate for gestational age.  Pain/Pressure: Present     ?Pelvic: Cervical exam deferred        ?Extremities: Normal range of motion.  Edema: None  ?Mental Status: Normal mood and affect. Normal behavior. Normal judgment and thought content.  ? ?Assessment and Plan:  ?Pregnancy: G2P1001 at [redacted]w[redacted]d ?1. Supervision of high risk pregnancy, antepartum ?28wk labs today. I told her that darkening around neck could be from insulin resistance which the GTT will check for. If negative, then likely due to pregnancy hormones. Pt stated that the darkening went away after the last pregnancy ?Has rpt growth on 5/15. Last growth normal. F/u to see if repeat scans are needed ? ?2. Asymptomatic bacteriuria during pregnancy in second  trimester ?E. coli and MDR. Pt asymptomatic. Recommend re-check only if symptomatic ? ?3. Breast discharge ?No s/s ? ?4. Gastroesophageal reflux disease without esophagitis ?Pepcid sent in ? ?Preterm labor symptoms and general obstetric precautions including but not limited to vaginal bleeding, contractions, leaking of fluid and fetal movement were reviewed in detail with the patient. ?Please refer to After Visit Summary for other counseling recommendations.  ? ?Return in about 2 weeks (around 06/19/2021) for in person, md or app, in person or virtual, virtual visit. ? ?Future Appointments  ?Date Time Provider [place]  ?06/29/2021 10:45 AM [facility] NURSE [facility]  ?06/29/2021 11:00 AM [facility] US1 [facility]  ? ? ?Aletha Halim, MD ? ?

## 2021-06-06 LAB — CBC
Hematocrit: 33.1 % — ABNORMAL LOW (ref 34.0–46.6)
Hemoglobin: 10.7 g/dL — ABNORMAL LOW (ref 11.1–15.9)
MCH: 27.5 pg (ref 26.6–33.0)
MCHC: 32.3 g/dL (ref 31.5–35.7)
MCV: 85 fL (ref 79–97)
Platelets: 224 10*3/uL (ref 150–450)
RBC: 3.89 x10E6/uL (ref 3.77–5.28)
RDW: 13.3 % (ref 11.7–15.4)
WBC: 5.9 10*3/uL (ref 3.4–10.8)

## 2021-06-06 LAB — GLUCOSE TOLERANCE, 2 HOURS W/ 1HR
Glucose, 1 hour: 146 mg/dL (ref 70–179)
Glucose, 2 hour: 143 mg/dL (ref 70–152)
Glucose, Fasting: 77 mg/dL (ref 70–91)

## 2021-06-06 LAB — RPR: RPR Ser Ql: NONREACTIVE

## 2021-06-06 LAB — HIV ANTIBODY (ROUTINE TESTING W REFLEX): HIV Screen 4th Generation wRfx: NONREACTIVE

## 2021-06-19 ENCOUNTER — Ambulatory Visit (INDEPENDENT_AMBULATORY_CARE_PROVIDER_SITE_OTHER): Payer: Medicaid Other | Admitting: Obstetrics & Gynecology

## 2021-06-19 VITALS — BP 107/82 | HR 107 | Wt 145.0 lb

## 2021-06-19 DIAGNOSIS — O0990 Supervision of high risk pregnancy, unspecified, unspecified trimester: Secondary | ICD-10-CM

## 2021-06-19 DIAGNOSIS — N883 Incompetence of cervix uteri: Secondary | ICD-10-CM

## 2021-06-19 MED ORDER — FERROUS SULFATE 325 (65 FE) MG PO TABS: 325.0000 mg | ORAL_TABLET | ORAL | 3 refills | Status: DC

## 2021-06-19 MED ORDER — PANTOPRAZOLE SODIUM 40 MG PO TBEC: 40.0000 mg | DELAYED_RELEASE_TABLET | Freq: Every day | ORAL | 2 refills | Status: DC

## 2021-06-19 NOTE — Progress Notes (Signed)
Patient stated that she does not need an interpreter  ?

## 2021-06-19 NOTE — Progress Notes (Signed)
Patient reports contractions that would happen "mornings and nights" She stated "my belly would get tight and last ore than a minute" Denies any vaginal bleeding or abnormal discharge.  ?

## 2021-06-19 NOTE — Progress Notes (Signed)
? ?  PRENATAL VISIT NOTE ? ?Subjective:  ?Danielle Monroe is a 31 y.o. G2P1001 at [redacted]w[redacted]d being seen today for ongoing prenatal care.  She is currently monitored for the following issues for this low-risk pregnancy and has Language barrier; Cervical incompetence; Supervision of high risk pregnancy, antepartum; and Asymptomatic bacteriuria during pregnancy in second trimester on their problem list. ? ?Patient reports heartburn.  Contractions: Irritability.  .  Movement: Present. Denies leaking of fluid.  ? ?The following portions of the patient's history were reviewed and updated as appropriate: allergies, current medications, past family history, past medical history, past social history, past surgical history and problem list.  ? ?Objective:  ? ?Vitals:  ? 06/19/21 1046  ?BP: 107/82  ?Pulse: (!) 107  ?Weight: 145 lb (65.8 kg)  ? ? ?Fetal Status: Fetal Heart Rate (bpm): 150   Movement: Present    ? ?General:  Alert, oriented and cooperative. Patient is in no acute distress.  ?Skin: Skin is warm and dry. No rash noted.   ?Cardiovascular: Normal heart rate noted  ?Respiratory: Normal respiratory effort, no problems with respiration noted  ?Abdomen: Soft, gravid, appropriate for gestational age.  Pain/Pressure: Present     ?Pelvic: Cervical exam deferred        ?Extremities: Normal range of motion.     ?Mental Status: Normal mood and affect. Normal behavior. Normal judgment and thought content.  ? ?Assessment and Plan:  ?Pregnancy: G2P1001 at [redacted]w[redacted]d ?1. Supervision of high risk pregnancy, antepartum ?D/c Pepcid ?- pantoprazole (PROTONIX) 40 MG tablet; Take 1 tablet (40 mg total) by mouth daily.  Dispense: 30 tablet; Refill: 2 ?- ferrous sulfate 325 (65 FE) MG tablet; Take 1 tablet (325 mg total) by mouth every other day.  Dispense: 30 tablet; Refill: 3 ? ?2. Cervical incompetence ?Nl cx length ? ?Preterm labor symptoms and general obstetric precautions including but not limited to vaginal bleeding, contractions,  leaking of fluid and fetal movement were reviewed in detail with the patient. ?Please refer to After Visit Summary for other counseling recommendations.  ? ?Return in about 2 weeks (around 07/03/2021). ? ?Future Appointments  ?Date Time Provider Department Center  ?06/29/2021 10:45 AM [facility] NURSE [facility]  ?06/29/2021 11:00 AM [facility] US1 [facility]  ?07/03/2021 10:35 AM Milas Hock, MD [facility]  ?07/17/2021 10:15 AM McVille Bing, MD [facility]  ?07/24/2021  9:15 AM Adam Phenix, MD [facility]  ?07/31/2021  9:15 AM Adam Phenix, MD [facility]  ?08/07/2021  9:15 AM Adam Phenix, MD [facility]  ?08/14/2021  9:15 AM Rolla Bing, MD [facility]  ? ? ?Scheryl Darter, MD ? ?

## 2021-06-22 ENCOUNTER — Telehealth: Payer: Medicaid Other | Admitting: Physician Assistant

## 2021-06-22 DIAGNOSIS — B9689 Other specified bacterial agents as the cause of diseases classified elsewhere: Secondary | ICD-10-CM | POA: Diagnosis not present

## 2021-06-22 DIAGNOSIS — J208 Acute bronchitis due to other specified organisms: Secondary | ICD-10-CM

## 2021-06-22 MED ORDER — GUAIFENESIN-DM 100-10 MG/5ML PO SYRP: 5.0000 mL | ORAL_SOLUTION | ORAL | 0 refills | Status: DC | PRN

## 2021-06-22 MED ORDER — AMOXICILLIN 500 MG PO CAPS: 500.0000 mg | ORAL_CAPSULE | Freq: Two times a day (BID) | ORAL | 0 refills | Status: DC

## 2021-06-22 NOTE — Progress Notes (Signed)
?Virtual Visit Consent  ? ?Danielle Monroe, you are scheduled for a virtual visit with a [place] provider today. Just as with appointments in the office, your consent must be obtained to participate. Your consent will be active for this visit and any virtual visit you may have with one of our providers in the next 365 days. If you have a MyChart account, a copy of this consent can be sent to you electronically. ? ?As this is a virtual visit, video technology does not allow for your provider to perform a traditional examination. This may limit your provider's ability to fully assess your condition. If your provider identifies any concerns that need to be evaluated in person or the need to arrange testing (such as labs, EKG, etc.), we will make arrangements to do so. Although advances in technology are sophisticated, we cannot ensure that it will always work on either your end or our end. If the connection with a video visit is poor, the visit may have to be switched to a telephone visit. With either a video or telephone visit, we are not always able to ensure that we have a secure connection. ? ?By engaging in this virtual visit, you consent to the provision of healthcare and authorize for your insurance to be billed (if applicable) for the services provided during this visit. Depending on your insurance coverage, you may receive a charge related to this service. ? ?I need to obtain your verbal consent now. Are you willing to proceed with your visit today? Zonnie K Kiger has provided verbal consent on 06/22/2021 for a virtual visit (video or telephone). Margaretann Loveless, PA-C ? ?Date: 06/22/2021 1:53 PM ? ?Virtual Visit via Video Note  ? ?Danielle Monroe, connected with  ROSEALYN LITTLE  ([id number], 1990-11-20) on 06/22/21 at  1:45 PM EDT by a video-enabled telemedicine application and verified that I am speaking with the correct person using two identifiers. ? ?Location: ?Patient: Virtual  Visit Location Patient: Home ?Provider: Virtual Visit Location Provider: Home Office ?  ?I discussed the limitations of evaluation and management by telemedicine and the availability of in person appointments. The patient expressed understanding and agreed to proceed.   ? ?History of Present Illness: ?Danielle Monroe is a 31 y.o. who identifies as a female who was assigned female at birth, and is being seen today for sore throat and cough. ? ?HPI: Sore Throat  ?This is a new problem. The current episode started in the past 7 days (5-6 days). The problem has been gradually worsening. There has been no fever. Associated symptoms include congestion, coughing, headaches, a hoarse voice, swollen glands and trouble swallowing. Pertinent negatives include no ear discharge, ear pain, plugged ear sensation, shortness of breath or vomiting. Associated symptoms comments: Sinus pain and nasal congestion. She has had no exposure to strep or mono. She has tried gargles (hot liquids with honey) for the symptoms. The treatment provided no relief.   ?[redacted]w[redacted]d pregnant ? ?Problems:  ?Patient Active Problem List  ? Diagnosis Date Noted  ? Asymptomatic bacteriuria during pregnancy in second trimester 04/11/2021  ? Cervical incompetence 04/08/2021  ? Supervision of high risk pregnancy, antepartum 04/08/2021  ? Language barrier 04/13/2016  ?  ?Allergies: No Known Allergies ?Medications:  ?Current Outpatient Medications:  ?  amoxicillin (AMOXIL) 500 MG capsule, Take 1 capsule (500 mg total) by mouth 2 (two) times daily for 10 days., Disp: 20 capsule, Rfl: 0 ?  guaiFENesin-dextromethorphan (ROBITUSSIN DM) 100-10 MG/5ML syrup,  Take 5 mLs by mouth every 4 (four) hours as needed for cough., Disp: 118 mL, Rfl: 0 ?  aspirin EC 81 MG tablet, Take 81 mg by mouth daily. Swallow whole., Disp: , Rfl:  ?  ferrous sulfate 325 (65 FE) MG tablet, Take 1 tablet (325 mg total) by mouth every other day., Disp: 30 tablet, Rfl: 3 ?  pantoprazole (PROTONIX)  40 MG tablet, Take 1 tablet (40 mg total) by mouth daily., Disp: 30 tablet, Rfl: 2 ?  Prenatal Vit-Fe Fumarate-FA (MULTIVITAMIN-PRENATAL) 27-0.8 MG TABS tablet, Take 1 tablet by mouth daily at 12 noon., Disp: 30 tablet, Rfl: 6 ?  sucralfate (CARAFATE) 1 g tablet, May take one tablet every 6 hours as needed for reflux., Disp: 30 tablet, Rfl: 2 ? ?Observations/Objective: ?Patient is well-developed, well-nourished in no acute distress.  ?Resting comfortably at home.  ?Head is normocephalic, atraumatic.  ?No labored breathing.  ?Speech is clear and coherent with logical content.  ?Patient is alert and oriented at baseline.  ? ? ?Assessment and Plan: ?1. Acute bacterial bronchitis ?- amoxicillin (AMOXIL) 500 MG capsule; Take 1 capsule (500 mg total) by mouth 2 (two) times daily for 10 days.  Dispense: 20 capsule; Refill: 0 ?- guaiFENesin-dextromethorphan (ROBITUSSIN DM) 100-10 MG/5ML syrup; Take 5 mLs by mouth every 4 (four) hours as needed for cough.  Dispense: 118 mL; Refill: 0 ? ?- Suspect strep throat ?- Amoxicillin prescribed ?- Tylenol as needed ?- Robitussin DM for cough ?- Salt water gargles ?- Chloraseptic spray ?- Push fluids ?- Seek in person evaluation if not improving or if symptoms worsen ? ?Follow Up Instructions: ?I discussed the assessment and treatment plan with the patient. The patient was provided an opportunity to ask questions and all were answered. The patient agreed with the plan and demonstrated an understanding of the instructions.  A copy of instructions were sent to the patient via MyChart unless otherwise noted below.  ? ? ?The patient was advised to call back or seek an in-person evaluation if the symptoms worsen or if the condition fails to improve as anticipated. ? ?Time:  ?I spent 11 minutes with the patient via telehealth technology discussing the above problems/concerns.   ? ?Margaretann Loveless, PA-C ?

## 2021-06-22 NOTE — Patient Instructions (Addendum)
?Danielle Monroe, thank you for joining Mar Daring, PA-C for today's virtual visit.  While this provider is not your primary care provider (PCP), if your PCP is located in our provider database this encounter information will be shared with them immediately following your visit. ? ?Consent: ?(Patient) Danielle Monroe provided verbal consent for this virtual visit at the beginning of the encounter. ? ?Current Medications: ? ?Current Outpatient Medications:  ?  amoxicillin (AMOXIL) 500 MG capsule, Take 1 capsule (500 mg total) by mouth 2 (two) times daily for 10 days., Disp: 20 capsule, Rfl: 0 ?  guaiFENesin-dextromethorphan (ROBITUSSIN DM) 100-10 MG/5ML syrup, Take 5 mLs by mouth every 4 (four) hours as needed for cough., Disp: 118 mL, Rfl: 0 ?  aspirin EC 81 MG tablet, Take 81 mg by mouth daily. Swallow whole., Disp: , Rfl:  ?  ferrous sulfate 325 (65 FE) MG tablet, Take 1 tablet (325 mg total) by mouth every other day., Disp: 30 tablet, Rfl: 3 ?  pantoprazole (PROTONIX) 40 MG tablet, Take 1 tablet (40 mg total) by mouth daily., Disp: 30 tablet, Rfl: 2 ?  Prenatal Vit-Fe Fumarate-FA (MULTIVITAMIN-PRENATAL) 27-0.8 MG TABS tablet, Take 1 tablet by mouth daily at 12 noon., Disp: 30 tablet, Rfl: 6 ?  sucralfate (CARAFATE) 1 g tablet, May take one tablet every 6 hours as needed for reflux., Disp: 30 tablet, Rfl: 2  ? ?Medications ordered in this encounter:  ?Meds ordered this encounter  ?Medications  ? amoxicillin (AMOXIL) 500 MG capsule  ?  Sig: Take 1 capsule (500 mg total) by mouth 2 (two) times daily for 10 days.  ?  Dispense:  20 capsule  ?  Refill:  0  ?  Order Specific Question:   Supervising Provider  ?  Answer:   Noemi Chapel [[id number]]  ? guaiFENesin-dextromethorphan (ROBITUSSIN DM) 100-10 MG/5ML syrup  ?  Sig: Take 5 mLs by mouth every 4 (four) hours as needed for cough.  ?  Dispense:  118 mL  ?  Refill:  0  ?  Order Specific Question:   Supervising Provider  ?  Answer:   Noemi Chapel [[id number]]  ?   ? ?*If you need refills on other medications prior to your next appointment, please contact your pharmacy* ? ?Follow-Up: ?Call back or seek an in-person evaluation if the symptoms worsen or if the condition fails to improve as anticipated. ? ?Other Instructions ? ?Common Medications Safe in Pregnancy ? ?Acne:      Constipation: ? Benzoyl Peroxide     Colace ? Clindamycin      Dulcolax Suppository ? Topica Erythromycin     Fibercon ? Salicylic Acid      Metamucil ?        Miralax ?AVOID:        Senakot  ? Accutane    Cough: ? Retin-A       Cough Drops ? Tetracycline      Phenergan w/ Codeine if Rx ? Minocycline      Robitussin (Plain & DM) ? ?Antibiotics:     Crabs/Lice: ? Ceclor       RID ? Cephalosporins    AVOID: ? E-Mycins      Kwell ? Keflex ? Macrobid/Macrodantin   Diarrhea: ? Penicillin      Kao-Pectate ? Zithromax      Imodium AD ?        PUSH FLUIDS ?AVOID:      ? Cipro     Fever: ? Tetracycline  Tylenol (Regular or Extra ? Minocycline       Strength) ? Levaquin      Extra Strength-Do not          Exceed 8 tabs/24 hrs ?Caffeine:       ? <222m/day (equiv. To 1 cup of coffee or ? approx. 3 12 oz sodas)   ?      Gas: ?Cold/Hayfever:       Gas-X ? Benadryl      Mylicon ? Claritin       Phazyme ? **Claritin-D       ? Chlor-Trimeton    Headaches: ? Dimetapp      ASA-Free Excedrin ? Drixoral-Non-Drowsy     Cold Compress ? Mucinex (Guaifenasin)     Tylenol (Regular or Extra ? Sudafed/Sudafed-12 Hour     Strength) ? **Sudafed PE Pseudoephedrine  ? Tylenol Cold & Sinus    ? Vicks Vapor Rub ? Zyrtec ? ?**AVOID if Problems With Blood Pressure ? ? ? ? ? ? ? ? ?Heartburn: Avoid lying down for at least 1 hour after meals ? Aciphex     ? Maalox     Rash: ? Milk of Magnesia     Benadryl   ? Mylanta       1% Hydrocortisone Cream ? Pepcid ? Pepcid Complete   Sleep Aids: ? Prevacid      Ambien  ? Prilosec       Benadryl ? Rolaids       Chamomile Tea ? Tums (Limit 4/day)     Unisom ?        Tylenol PM ?        Warm  milk-add vanilla or  ?Hemorrhoids:       Sugar for taste ? Anusol/Anusol H.C. ? (RX: Analapram 2.5%)  Sugar Substitutes: ? Hydrocortisone OTC     Ok in moderation ? Preparation H     ? Tucks       ? Vaseline lotion applied to tissue with wiping   ? ?Herpes:     Throat: ? Acyclovir      Oragel ? Famvir ? Valtrex     Vaccines: ?        Flu Shot ?Leg Cramps:       *Gardasil ? Benadryl      Hepatitis A ?        Hepatitis B ?Nasal Spray:       Pneumovax ? Saline Nasal Spray     Polio Booster ?        Tetanus ?Nausea:       Tuberculosis test or PPD ? Vitamin B6 25 mg TID   AVOID:   ? Dramamine      *Gardasil ? Emetrol       Live Poliovirus ? Ginger Root 250 mg QID    MMR (measles, mumps & ? High Complex Carbs @ Bedtime    rebella) ? Sea Bands-Accupressure    Varicella (Chickenpox) ? Unisom 1/2 tab TID     *No known complications  ?         If received before ?Pain:         Known pregnancy;  ? Darvocet       Resume series after ? Lortab        Delivery ? Percocet    Yeast:  ? Tramadol      Femstat ? Tylenol 3      Gyne-lotrimin ? Ultram       Monistat ?  Vicodin     ?      MISC: ?        All Sunscreens   ?        Hair Coloring/highlights  ?        Insect Repellant's ?         (Including DEET) ?        Mystic Tans ? ? ? ?If you have been instructed to have an in-person evaluation today at a local Urgent Care facility, please use the link below. It will take you to a list of all of our available [facility], including address, phone number and hours of operation. Please do not delay care.  ?[facility] ? ?If you or a family member do not have a primary care provider, use the link below to schedule a visit and establish care. When you choose a [facility] primary care physician or advanced practice provider, you gain a long-term partner in health. ?Find a Primary Care Provider ? ?Learn more about [facility] in-office and virtual care options: ?[facility] Now ?

## 2021-06-29 ENCOUNTER — Ambulatory Visit: Payer: Medicaid Other | Attending: Obstetrics

## 2021-06-29 ENCOUNTER — Ambulatory Visit: Payer: Medicaid Other | Admitting: *Deleted

## 2021-06-29 ENCOUNTER — Encounter: Payer: Self-pay | Admitting: *Deleted

## 2021-06-29 VITALS — BP 112/74 | HR 113

## 2021-06-29 DIAGNOSIS — N883 Incompetence of cervix uteri: Secondary | ICD-10-CM

## 2021-06-29 DIAGNOSIS — O99891 Other specified diseases and conditions complicating pregnancy: Secondary | ICD-10-CM | POA: Diagnosis present

## 2021-06-29 DIAGNOSIS — Z3689 Encounter for other specified antenatal screening: Secondary | ICD-10-CM | POA: Insufficient documentation

## 2021-06-29 DIAGNOSIS — O0990 Supervision of high risk pregnancy, unspecified, unspecified trimester: Secondary | ICD-10-CM

## 2021-06-29 DIAGNOSIS — R8271 Bacteriuria: Secondary | ICD-10-CM | POA: Insufficient documentation

## 2021-06-29 DIAGNOSIS — Z3687 Encounter for antenatal screening for uncertain dates: Secondary | ICD-10-CM

## 2021-07-02 NOTE — Progress Notes (Addendum)
   PRENATAL VISIT NOTE  Subjective:  Danielle Monroe is a 31 y.o. G2P1001 at [redacted]w[redacted]d being seen today for ongoing prenatal care.  She is currently monitored for the following issues for this low-risk pregnancy and has Language barrier; Cervical incompetence; Supervision of high risk pregnancy, antepartum; and Asymptomatic bacteriuria during pregnancy in second trimester on their problem list.  Patient reports  persistent cough - takes OTC meds with relief .  Cough is worse in the morning and at night. She does take pepcid qAM.  Contractions: Irritability. Vag. Bleeding: None.  Movement: Present. Denies leaking of fluid.   The following portions of the patient's history were reviewed and updated as appropriate: allergies, current medications, past family history, past medical history, past social history, past surgical history and problem list.   Objective:   Vitals:   07/03/21 1123  BP: 115/81  Pulse: (!) 109  Weight: 145 lb (65.8 kg)    Fetal Status: Fetal Heart Rate (bpm): 150 Fundal Height: 34 cm Movement: Present     General:  Alert, oriented and cooperative. Patient is in no acute distress.  Skin: Skin is warm and dry. No rash noted.   Cardiovascular: Normal heart rate noted  Respiratory: Normal respiratory effort, no problems with respiration noted  Abdomen: Soft, gravid, appropriate for gestational age.  Pain/Pressure: Absent     Pelvic: Cervical exam deferred        Extremities: Normal range of motion.     Mental Status: Normal mood and affect. Normal behavior. Normal judgment and thought content.   Assessment and Plan:  Pregnancy: G2P1001 at [redacted]w[redacted]d 1. Language barrier - Pt speaks english well.   2. Supervision of high risk pregnancy, antepartum - PNC up to date, last growth 5/15 was normal. No f/u needed unless indicated.  - She will try pepcid before bed to see if it helps the cough.   3. Asymptomatic bacteriuria during pregnancy in second trimester - Ucx for TOC  done today.    Preterm labor symptoms and general obstetric precautions including but not limited to vaginal bleeding, contractions, leaking of fluid and fetal movement were reviewed in detail with the patient. Please refer to After Visit Summary for other counseling recommendations.   No follow-ups on file.  Future Appointments  Date Time Provider Department Center  07/17/2021 10:15 AM [place] Bing, MD [facility]  07/24/2021  9:15 AM Adam Phenix, MD [facility]  07/31/2021  9:15 AM Adam Phenix, MD [facility]  08/07/2021  9:15 AM Adam Phenix, MD [facility]  08/14/2021  9:15 AM  Bing, MD [facility]    Milas Hock, MD

## 2021-07-03 ENCOUNTER — Encounter: Payer: Medicaid Other | Admitting: Obstetrics and Gynecology

## 2021-07-03 ENCOUNTER — Ambulatory Visit (INDEPENDENT_AMBULATORY_CARE_PROVIDER_SITE_OTHER): Payer: Medicaid Other | Admitting: Obstetrics and Gynecology

## 2021-07-03 VITALS — BP 115/81 | HR 109 | Wt 145.0 lb

## 2021-07-03 DIAGNOSIS — O99891 Other specified diseases and conditions complicating pregnancy: Secondary | ICD-10-CM

## 2021-07-03 DIAGNOSIS — R8271 Bacteriuria: Secondary | ICD-10-CM

## 2021-07-03 DIAGNOSIS — O0990 Supervision of high risk pregnancy, unspecified, unspecified trimester: Secondary | ICD-10-CM

## 2021-07-03 DIAGNOSIS — Z789 Other specified health status: Secondary | ICD-10-CM

## 2021-07-03 NOTE — Progress Notes (Signed)
Patient stated that's she does not need an interpreter.   Toranina reports daily coughing for about 2 weeks. She stated she takes Robitussin occasionally for the cough  But it does not seem to help

## 2021-07-17 ENCOUNTER — Ambulatory Visit (INDEPENDENT_AMBULATORY_CARE_PROVIDER_SITE_OTHER): Payer: Medicaid Other | Admitting: Obstetrics and Gynecology

## 2021-07-17 VITALS — BP 119/84 | HR 110 | Wt 149.0 lb

## 2021-07-17 DIAGNOSIS — O99891 Other specified diseases and conditions complicating pregnancy: Secondary | ICD-10-CM

## 2021-07-17 DIAGNOSIS — R8271 Bacteriuria: Secondary | ICD-10-CM

## 2021-07-17 DIAGNOSIS — Z3A34 34 weeks gestation of pregnancy: Secondary | ICD-10-CM

## 2021-07-17 DIAGNOSIS — O0990 Supervision of high risk pregnancy, unspecified, unspecified trimester: Secondary | ICD-10-CM

## 2021-07-17 NOTE — Patient Instructions (Signed)
Pregnancy Belt

## 2021-07-17 NOTE — Progress Notes (Signed)
Patient reports occasional tightening of stomach but denies any significant pain or vaginal bleeding. She also reports good fetal movements from baby.  No questions or concerns

## 2021-07-20 NOTE — Progress Notes (Signed)
   PRENATAL VISIT NOTE  Subjective:  Danielle Monroe is a 31 y.o. G2P1001 at [redacted]w[redacted]d being seen today for ongoing prenatal care.  She is currently monitored for the following issues for this high-risk pregnancy and has Language barrier; Cervical incompetence; Supervision of high risk pregnancy, antepartum; and Asymptomatic bacteriuria during pregnancy in second trimester on their problem list.  Patient reports  low back and pelvic pressure .  Contractions: Irritability. Vag. Bleeding: None.  Movement: Present. Denies leaking of fluid.   The following portions of the patient's history were reviewed and updated as appropriate: allergies, current medications, past family history, past medical history, past social history, past surgical history and problem list.   Objective:   Vitals:   07/17/21 1037  BP: 119/84  Pulse: (!) 110  Weight: 149 lb (67.6 kg)    Fetal Status: Fetal Heart Rate (bpm): 155   Movement: Present     General:  Alert, oriented and cooperative. Patient is in no acute distress.  Skin: Skin is warm and dry. No rash noted.   Cardiovascular: Normal heart rate noted  Respiratory: Normal respiratory effort, no problems with respiration noted  Abdomen: Soft, gravid, appropriate for gestational age.  Pain/Pressure: Present     Pelvic: Cervical exam deferred        Extremities: Normal range of motion.     Mental Status: Normal mood and affect. Normal behavior. Normal judgment and thought content.   Assessment and Plan:  Pregnancy: G2P1001 at [redacted]w[redacted]d 1. [redacted] weeks gestation of pregnancy Routine pregnancy discomfort. Recommend pregnancy belt. Gbs nv  2. Supervision of high risk pregnancy, antepartum 5/15 growth u/s 70%, 2082gm, ac 72%, bpp 8/8, afi 21., repeat PRN  3. Asymptomatic bacteriuria during pregnancy in second trimester D/w ID Dr. Gale Journey and no need for surveillance since <100k  Preterm labor symptoms and general obstetric precautions including but not limited to  vaginal bleeding, contractions, leaking of fluid and fetal movement were reviewed in detail with the patient. Please refer to After Visit Summary for other counseling recommendations.   Return in about 2 weeks (around 07/31/2021) for in person, md or app, low risk ob.  Future Appointments  Date Time Provider [place]  07/24/2021  9:15 AM Woodroe Mode, MD [facility]  07/31/2021  9:15 AM Woodroe Mode, MD [facility]  08/07/2021  9:15 AM Woodroe Mode, MD [facility]  08/14/2021  9:15 AM Aletha Halim, MD [facility]  08/20/2021  8:55 AM Ardean Larsen, Mervyn Skeeters, CNM [facility]    Aletha Halim, MD

## 2021-07-24 ENCOUNTER — Ambulatory Visit (INDEPENDENT_AMBULATORY_CARE_PROVIDER_SITE_OTHER): Payer: Medicaid Other | Admitting: Obstetrics & Gynecology

## 2021-07-24 VITALS — BP 125/77 | HR 98 | Wt 152.0 lb

## 2021-07-24 DIAGNOSIS — O0990 Supervision of high risk pregnancy, unspecified, unspecified trimester: Secondary | ICD-10-CM

## 2021-07-24 DIAGNOSIS — Z789 Other specified health status: Secondary | ICD-10-CM

## 2021-07-24 MED ORDER — PRENATAL 27-0.8 MG PO TABS: 1.0000 | ORAL_TABLET | Freq: Every day | ORAL | 6 refills | Status: AC

## 2021-07-24 NOTE — Progress Notes (Signed)
Patient reports pain in right leg when she is standing.

## 2021-07-24 NOTE — Progress Notes (Signed)
   PRENATAL VISIT NOTE  Subjective:  Danielle Monroe is a 31 y.o. G2P1001 at [redacted]w[redacted]d being seen today for ongoing prenatal care.  She is currently monitored for the following issues for this low-risk pregnancy and has Language barrier; Cervical incompetence; Supervision of high risk pregnancy, antepartum; and Asymptomatic bacteriuria during pregnancy in second trimester on their problem list.  Patient reports  RLQ pressure and leg pain .  Contractions: Irritability. Vag. Bleeding: None.  Movement: Present. Denies leaking of fluid.   The following portions of the patient's history were reviewed and updated as appropriate: allergies, current medications, past family history, past medical history, past social history, past surgical history and problem list.   Objective:   Vitals:   07/24/21 0928  BP: 125/77  Pulse: 98  Weight: 152 lb (68.9 kg)    Fetal Status: Fetal Heart Rate (bpm): 136 Fundal Height: 36 cm Movement: Present     General:  Alert, oriented and cooperative. Patient is in no acute distress.  Skin: Skin is warm and dry. No rash noted.   Cardiovascular: Normal heart rate noted  Respiratory: Normal respiratory effort, no problems with respiration noted  Abdomen: Soft, gravid, appropriate for gestational age.  Pain/Pressure: Present     Pelvic: Cervical exam deferred        Extremities: Normal range of motion.  Edema: Trace  Mental Status: Normal mood and affect. Normal behavior. Normal judgment and thought content.   Assessment and Plan:  Pregnancy: G2P1001 at [redacted]w[redacted]d 1. Supervision of high risk pregnancy, antepartum Refill requested - Prenatal Vit-Fe Fumarate-FA (MULTIVITAMIN-PRENATAL) 27-0.8 MG TABS tablet; Take 1 tablet by mouth daily at 12 noon.  Dispense: 30 tablet; Refill: 6  2. Language barrier Speaks English as well  Preterm labor symptoms and general obstetric precautions including but not limited to vaginal bleeding, contractions, leaking of fluid and fetal  movement were reviewed in detail with the patient. Please refer to After Visit Summary for other counseling recommendations.   Return in about 1 week (around 07/31/2021).  Future Appointments  Date Time Provider [place]  07/31/2021  9:15 AM Woodroe Mode, MD [facility]  08/07/2021  9:15 AM Woodroe Mode, MD [facility]  08/14/2021  9:15 AM Aletha Halim, MD [facility]  08/20/2021  9:35 AM Starr Lake, CNM [facility]    Emeterio Reeve, MD

## 2021-07-31 ENCOUNTER — Ambulatory Visit (INDEPENDENT_AMBULATORY_CARE_PROVIDER_SITE_OTHER): Payer: Medicaid Other | Admitting: Obstetrics & Gynecology

## 2021-07-31 ENCOUNTER — Other Ambulatory Visit (HOSPITAL_COMMUNITY)
Admission: RE | Admit: 2021-07-31 | Discharge: 2021-07-31 | Disposition: A | Discharge status: Home / Self Care | Payer: Medicaid Other | Source: Ambulatory Visit | Attending: Obstetrics & Gynecology | Admitting: Obstetrics & Gynecology

## 2021-07-31 VITALS — BP 120/88 | HR 90 | Wt 154.0 lb

## 2021-07-31 DIAGNOSIS — O0990 Supervision of high risk pregnancy, unspecified, unspecified trimester: Secondary | ICD-10-CM | POA: Insufficient documentation

## 2021-08-02 LAB — GC/CHLAMYDIA PROBE AMP (~~LOC~~) NOT AT ARMC
Chlamydia: NEGATIVE
Comment: NEGATIVE
Comment: NORMAL
Neisseria Gonorrhea: NEGATIVE

## 2021-08-03 LAB — CULTURE, BETA STREP (GROUP B ONLY): Strep Gp B Culture: POSITIVE — AB

## 2021-08-07 ENCOUNTER — Ambulatory Visit (INDEPENDENT_AMBULATORY_CARE_PROVIDER_SITE_OTHER): Payer: Medicaid Other | Admitting: Obstetrics & Gynecology

## 2021-08-07 ENCOUNTER — Other Ambulatory Visit: Payer: Self-pay

## 2021-08-07 VITALS — BP 125/89 | HR 94 | Wt 154.2 lb

## 2021-08-07 DIAGNOSIS — O2686 Pruritic urticarial papules and plaques of pregnancy (PUPPP): Secondary | ICD-10-CM

## 2021-08-07 DIAGNOSIS — Z789 Other specified health status: Secondary | ICD-10-CM

## 2021-08-07 DIAGNOSIS — N883 Incompetence of cervix uteri: Secondary | ICD-10-CM

## 2021-08-07 DIAGNOSIS — O99820 Streptococcus B carrier state complicating pregnancy: Secondary | ICD-10-CM

## 2021-08-07 DIAGNOSIS — Z758 Other problems related to medical facilities and other health care: Secondary | ICD-10-CM

## 2021-08-07 DIAGNOSIS — O0990 Supervision of high risk pregnancy, unspecified, unspecified trimester: Secondary | ICD-10-CM

## 2021-08-14 ENCOUNTER — Other Ambulatory Visit (HOSPITAL_COMMUNITY)
Admission: RE | Admit: 2021-08-14 | Discharge: 2021-08-14 | Disposition: A | Discharge status: Home / Self Care | Payer: Medicaid Other | Source: Ambulatory Visit | Attending: Obstetrics and Gynecology | Admitting: Obstetrics and Gynecology

## 2021-08-14 ENCOUNTER — Ambulatory Visit (INDEPENDENT_AMBULATORY_CARE_PROVIDER_SITE_OTHER): Payer: Medicaid Other | Admitting: Obstetrics and Gynecology

## 2021-08-14 ENCOUNTER — Encounter: Payer: Self-pay | Admitting: Obstetrics and Gynecology

## 2021-08-14 ENCOUNTER — Other Ambulatory Visit: Payer: Self-pay

## 2021-08-14 ENCOUNTER — Encounter (HOSPITAL_COMMUNITY): Payer: Self-pay | Admitting: *Deleted

## 2021-08-14 ENCOUNTER — Telehealth (HOSPITAL_COMMUNITY): Payer: Self-pay | Admitting: *Deleted

## 2021-08-14 VITALS — BP 124/88 | HR 92 | Wt 155.9 lb

## 2021-08-14 DIAGNOSIS — N898 Other specified noninflammatory disorders of vagina: Secondary | ICD-10-CM

## 2021-08-14 NOTE — Telephone Encounter (Signed)
Preadmission screen  

## 2021-08-14 NOTE — Progress Notes (Unsigned)
   PRENATAL VISIT NOTE  Subjective:  Danielle Monroe is a 31 y.o. G2P1001 at [redacted]w[redacted]d being seen today for ongoing prenatal care.  She is currently monitored for the following issues for this low-risk pregnancy and has Language barrier; Supervision of high risk pregnancy, antepartum; and Asymptomatic bacteriuria during pregnancy in second trimester on their problem list.  Patient reports no complaints.  Contractions: Irritability. Vag. Bleeding: None.  Movement: Present. Denies leaking of fluid.   The following portions of the patient's history were reviewed and updated as appropriate: allergies, current medications, past family history, past medical history, past social history, past surgical history and problem list.   Objective:   Vitals:   08/14/21 0929  BP: 124/88  Pulse: 92  Weight: 155 lb 14.4 oz (70.7 kg)    Fetal Status: Fetal Heart Rate (bpm): 135 Fundal Height: 38 cm Movement: Present  Presentation: Vertex  General:  Alert, oriented and cooperative. Patient is in no acute distress.  Skin: Skin is warm and dry. No rash noted.   Cardiovascular: Normal heart rate noted  Respiratory: Normal respiratory effort, no problems with respiration noted  Abdomen: Soft, gravid, appropriate for gestational age.  Pain/Pressure: Present     Pelvic: Cervical exam performed in the presence of a chaperone Dilation: 1.5 Effacement (%): 50 Station: Ballotable  Extremities: Normal range of motion.  Edema: Trace  Mental Status: Normal mood and affect. Normal behavior. Normal judgment and thought content.   Assessment and Plan:  Pregnancy: G2P1001 at [redacted]w[redacted]d 1. Vaginal discharge F/u swab. Pt amenable to PDIOL. Request sent. GBS+ - Cervicovaginal ancillary only( [place]) - CBC; Standing - Type and screen; Standing - RPR; Standing  Term labor symptoms and general obstetric precautions including but not limited to vaginal bleeding, contractions, leaking of fluid and fetal movement were  reviewed in detail with the patient. Please refer to After Visit Summary for other counseling recommendations.   Return in about 1 week (around 08/21/2021) for in person, md or app, low risk ob.  Future Appointments  Date Time Provider Department Center  08/20/2021  9:35 AM Marylene Land, CNM [facility]  08/29/2021  7:15 AM [facility]-LD SCHED ROOM [facility] None     Bing, MD

## 2021-08-15 ENCOUNTER — Encounter: Payer: Self-pay | Admitting: Obstetrics and Gynecology

## 2021-08-17 LAB — CERVICOVAGINAL ANCILLARY ONLY
Bacterial Vaginitis (gardnerella): NEGATIVE
Candida Glabrata: NEGATIVE
Candida Vaginitis: NEGATIVE
Chlamydia: NEGATIVE
Comment: NEGATIVE
Comment: NEGATIVE
Comment: NEGATIVE
Comment: NEGATIVE
Comment: NEGATIVE
Comment: NORMAL
Neisseria Gonorrhea: NEGATIVE
Trichomonas: NEGATIVE

## 2021-08-20 ENCOUNTER — Ambulatory Visit (INDEPENDENT_AMBULATORY_CARE_PROVIDER_SITE_OTHER): Payer: Medicaid Other | Admitting: Student

## 2021-08-20 ENCOUNTER — Other Ambulatory Visit: Payer: Self-pay

## 2021-08-20 VITALS — BP 134/93 | HR 94 | Wt 157.0 lb

## 2021-08-20 DIAGNOSIS — Z3A39 39 weeks gestation of pregnancy: Secondary | ICD-10-CM

## 2021-08-20 NOTE — Progress Notes (Signed)
Patient reports pelvic pain along with intermittent contractions. Also reports decrease FM.   Patient wants to know if she can change her IOL from the 15th to the 13th. She stated "I don't want baby to go far past due date"

## 2021-08-20 NOTE — Progress Notes (Signed)
   PRENATAL VISIT NOTE  Subjective:  Danielle Monroe is a 31 y.o. G2P1001 at [redacted]w[redacted]d being seen today for ongoing prenatal care.  She is currently monitored for the following issues for this low-risk pregnancy and has Language barrier; Supervision of high risk pregnancy, antepartum; and Asymptomatic bacteriuria during pregnancy in second trimester on their problem list.  Patient reports no complaints.  Contractions: Irritability. Vag. Bleeding: None.  Movement: (!) Decreased. Denies leaking of fluid.   The following portions of the patient's history were reviewed and updated as appropriate: allergies, current medications, past family history, past medical history, past social history, past surgical history and problem list.   Objective:   Vitals:   08/20/21 0941  BP: (!) 134/93  Pulse: 94  Weight: 157 lb (71.2 kg)    Fetal Status: Fetal Heart Rate (bpm): 145   Movement: (!) Decreased     General:  Alert, oriented and cooperative. Patient is in no acute distress.  Skin: Skin is warm and dry. No rash noted.   Cardiovascular: Normal heart rate noted  Respiratory: Normal respiratory effort, no problems with respiration noted  Abdomen: Soft, gravid, appropriate for gestational age.  Pain/Pressure: Present     Pelvic: Cervical exam deferred        Extremities: Normal range of motion.  Edema: None  Mental Status: Normal mood and affect. Normal behavior. Normal judgment and thought content.   Assessment and Plan:  Pregnancy: G2P1001 at [redacted]w[redacted]d 1. [redacted] weeks gestation of pregnancy   -doing well, IOL moved per patient request -reviewed LARC methods; she will think about it.  -patient had many questions about IOL; answered questions about IOL methods, pain relief, timing, risk of c/section, information given  Journalist, newspaper.  -long conversation with patient about fetal movements; patient established that baby is moving like normal; she was given strict return instructions and when to return to  MAU -she was informed to wait by phone for IOL phone call on the 13  Preterm labor symptoms and general obstetric precautions including but not limited to vaginal bleeding, contractions, leaking of fluid and fetal movement were reviewed in detail with the patient. Please refer to After Visit Summary for other counseling recommendations.   Return if possible for NST/AFI on MOn or tues or Wed.  Future Appointments  Date Time Provider Department Center  08/27/2021  7:00 AM [facility] SCHED ROOM [facility]-IN[facility] None    Marylene Land, [place]

## 2021-08-20 NOTE — Patient Instructions (Signed)
[email or web address]

## 2021-08-21 ENCOUNTER — Other Ambulatory Visit: Payer: Self-pay | Admitting: Obstetrics & Gynecology

## 2021-08-21 ENCOUNTER — Encounter (HOSPITAL_COMMUNITY): Payer: Self-pay | Admitting: Obstetrics and Gynecology

## 2021-08-21 ENCOUNTER — Inpatient Hospital Stay (HOSPITAL_COMMUNITY)
Admission: AD | Admit: 2021-08-21 | Discharge: 2021-08-23 | DRG: 807 | Disposition: A | Discharge status: Home / Self Care | Payer: Medicaid Other | Attending: Obstetrics & Gynecology | Admitting: Obstetrics & Gynecology

## 2021-08-21 DIAGNOSIS — O26893 Other specified pregnancy related conditions, third trimester: Secondary | ICD-10-CM | POA: Diagnosis present

## 2021-08-21 DIAGNOSIS — Z789 Other specified health status: Secondary | ICD-10-CM | POA: Diagnosis present

## 2021-08-21 DIAGNOSIS — O480 Post-term pregnancy: Principal | ICD-10-CM | POA: Diagnosis present

## 2021-08-21 DIAGNOSIS — O0990 Supervision of high risk pregnancy, unspecified, unspecified trimester: Principal | ICD-10-CM

## 2021-08-21 DIAGNOSIS — Z8679 Personal history of other diseases of the circulatory system: Secondary | ICD-10-CM

## 2021-08-21 DIAGNOSIS — O165 Unspecified maternal hypertension, complicating the puerperium: Secondary | ICD-10-CM

## 2021-08-21 DIAGNOSIS — N898 Other specified noninflammatory disorders of vagina: Secondary | ICD-10-CM

## 2021-08-21 DIAGNOSIS — O99824 Streptococcus B carrier state complicating childbirth: Secondary | ICD-10-CM | POA: Diagnosis present

## 2021-08-21 DIAGNOSIS — Z8759 Personal history of other complications of pregnancy, childbirth and the puerperium: Secondary | ICD-10-CM

## 2021-08-21 DIAGNOSIS — O99891 Other specified diseases and conditions complicating pregnancy: Secondary | ICD-10-CM

## 2021-08-21 DIAGNOSIS — R8271 Bacteriuria: Secondary | ICD-10-CM | POA: Diagnosis present

## 2021-08-21 DIAGNOSIS — R030 Elevated blood-pressure reading, without diagnosis of hypertension: Secondary | ICD-10-CM | POA: Diagnosis present

## 2021-08-21 DIAGNOSIS — Z3A39 39 weeks gestation of pregnancy: Secondary | ICD-10-CM | POA: Diagnosis not present

## 2021-08-21 DIAGNOSIS — Z758 Other problems related to medical facilities and other health care: Secondary | ICD-10-CM | POA: Diagnosis present

## 2021-08-21 LAB — COMPREHENSIVE METABOLIC PANEL
ALT: 16 U/L (ref 0–44)
AST: 25 U/L (ref 15–41)
Albumin: 2.5 g/dL — ABNORMAL LOW (ref 3.5–5.0)
Alkaline Phosphatase: 150 U/L — ABNORMAL HIGH (ref 38–126)
Anion gap: 9 (ref 5–15)
BUN: 8 mg/dL (ref 6–20)
CO2: 18 mmol/L — ABNORMAL LOW (ref 22–32)
Calcium: 8.8 mg/dL — ABNORMAL LOW (ref 8.9–10.3)
Chloride: 107 mmol/L (ref 98–111)
Creatinine, Ser: 0.58 mg/dL (ref 0.44–1.00)
GFR, Estimated: >60 mL/min (ref >60)
Glucose, Bld: 101 mg/dL — ABNORMAL HIGH (ref 70–99)
Potassium: 4.2 mmol/L (ref 3.5–5.1)
Sodium: 134 mmol/L — ABNORMAL LOW (ref 135–145)
Total Bilirubin: 0.4 mg/dL (ref 0.3–1.2)
Total Protein: 5.8 g/dL — ABNORMAL LOW (ref 6.5–8.1)

## 2021-08-21 LAB — CBC
HCT: 36.2 % (ref 36.0–46.0)
Hemoglobin: 12 g/dL (ref 12.0–15.0)
MCH: 27.7 pg (ref 26.0–34.0)
MCHC: 33.1 g/dL (ref 30.0–36.0)
MCV: 83.6 fL (ref 80.0–100.0)
Platelets: 222 10*3/uL (ref 150–400)
RBC: 4.33 MIL/uL (ref 3.87–5.11)
RDW: 14.3 % (ref 11.5–15.5)
WBC: 7.6 10*3/uL (ref 4.0–10.5)
nRBC: 0 % (ref 0.0–0.2)

## 2021-08-21 LAB — RPR: RPR Ser Ql: NONREACTIVE

## 2021-08-21 LAB — TYPE AND SCREEN
ABO/RH(D): O POS
Antibody Screen: NEGATIVE

## 2021-08-21 MED ORDER — NIFEDIPINE ER OSMOTIC RELEASE 30 MG PO TB24
30.0000 mg | ORAL_TABLET | Freq: Every day | ORAL | Status: DC
Start: 2021-08-21 — End: 2021-08-22
  Administered 2021-08-21: 30 mg via ORAL
  Filled 2021-08-21: qty 1

## 2021-08-21 MED ORDER — ZOLPIDEM TARTRATE 5 MG PO TABS: 5.0000 mg | ORAL_TABLET | Freq: Every evening | ORAL | Status: DC | PRN

## 2021-08-21 MED ORDER — OXYTOCIN BOLUS FROM INFUSION
333.0000 mL | Freq: Once | INTRAVENOUS | Status: AC
  Administered 2021-08-21: 333 mL via INTRAVENOUS

## 2021-08-21 MED ORDER — ACETAMINOPHEN 325 MG PO TABS
650.0000 mg | ORAL_TABLET | ORAL | Status: DC | PRN
  Administered 2021-08-22: 650 mg via ORAL
  Filled 2021-08-21: qty 2

## 2021-08-21 MED ORDER — TETANUS-DIPHTH-ACELL PERTUSSIS 5-2.5-18.5 LF-MCG/0.5 IM SUSY: 0.5000 mL | PREFILLED_SYRINGE | Freq: Once | INTRAMUSCULAR | Status: DC

## 2021-08-21 MED ORDER — AMPICILLIN SODIUM 2 G IJ SOLR
2.0000 g | Freq: Once | INTRAMUSCULAR | Status: AC
  Administered 2021-08-21: 2 g via INTRAVENOUS

## 2021-08-21 MED ORDER — LACTATED RINGERS IV SOLN: INTRAVENOUS | Status: DC

## 2021-08-21 MED ORDER — SODIUM CHLORIDE 0.9% FLUSH: 3.0000 mL | Freq: Two times a day (BID) | INTRAVENOUS | Status: DC

## 2021-08-21 MED ORDER — DIPHENHYDRAMINE HCL 25 MG PO CAPS: 25.0000 mg | ORAL_CAPSULE | Freq: Four times a day (QID) | ORAL | Status: DC | PRN

## 2021-08-21 MED ORDER — SODIUM CHLORIDE 0.9 % IV SOLN: INTRAVENOUS | Status: DC | PRN

## 2021-08-21 MED ORDER — ACETAMINOPHEN 325 MG PO TABS: 650.0000 mg | ORAL_TABLET | ORAL | Status: DC | PRN

## 2021-08-21 MED ORDER — ONDANSETRON HCL 4 MG/2ML IJ SOLN: 4.0000 mg | Freq: Four times a day (QID) | INTRAMUSCULAR | Status: DC | PRN

## 2021-08-21 MED ORDER — SOD CITRATE-CITRIC ACID 500-334 MG/5ML PO SOLN: 30.0000 mL | ORAL | Status: DC | PRN

## 2021-08-21 MED ORDER — LACTATED RINGERS IV SOLN: 500.0000 mL | INTRAVENOUS | Status: DC | PRN

## 2021-08-21 MED ORDER — ONDANSETRON HCL 4 MG PO TABS: 4.0000 mg | ORAL_TABLET | ORAL | Status: DC | PRN

## 2021-08-21 MED ORDER — PENICILLIN G POT IN DEXTROSE 60000 UNIT/ML IV SOLN: 3.0000 10*6.[IU] | INTRAVENOUS | Status: DC

## 2021-08-21 MED ORDER — BENZOCAINE-MENTHOL 20-0.5 % EX AERO
1.0000 | INHALATION_SPRAY | CUTANEOUS | Status: DC | PRN
Start: 2021-08-21 — End: 2021-08-23
  Administered 2021-08-21: 1 via TOPICAL
  Filled 2021-08-21: qty 56

## 2021-08-21 MED ORDER — WITCH HAZEL-GLYCERIN EX PADS
1.0000 | MEDICATED_PAD | CUTANEOUS | Status: DC | PRN
Start: 2021-08-21 — End: 2021-08-23

## 2021-08-21 MED ORDER — SODIUM CHLORIDE 0.9 % IV SOLN: 5.0000 10*6.[IU] | Freq: Once | INTRAVENOUS | Status: DC

## 2021-08-21 MED ORDER — OXYTOCIN-SODIUM CHLORIDE 30-0.9 UT/500ML-% IV SOLN
2.5000 [IU]/h | INTRAVENOUS | Status: DC
  Filled 2021-08-21: qty 500

## 2021-08-21 MED ORDER — DIBUCAINE (PERIANAL) 1 % EX OINT
1.0000 | TOPICAL_OINTMENT | CUTANEOUS | Status: DC | PRN
Start: 2021-08-21 — End: 2021-08-23

## 2021-08-21 MED ORDER — FENTANYL CITRATE (PF) 100 MCG/2ML IJ SOLN
100.0000 ug | INTRAMUSCULAR | Status: DC | PRN
  Administered 2021-08-21: 100 ug via INTRAVENOUS

## 2021-08-21 MED ORDER — SODIUM CHLORIDE 0.9% FLUSH: 3.0000 mL | INTRAVENOUS | Status: DC | PRN

## 2021-08-21 MED ORDER — MEASLES, MUMPS & RUBELLA VAC IJ SOLR: 0.5000 mL | Freq: Once | INTRAMUSCULAR | Status: DC

## 2021-08-21 MED ORDER — SENNOSIDES-DOCUSATE SODIUM 8.6-50 MG PO TABS
2.0000 | ORAL_TABLET | ORAL | Status: DC
  Administered 2021-08-21 – 2021-08-22 (×2): 2 via ORAL
  Filled 2021-08-21 (×3): qty 2

## 2021-08-21 MED ORDER — FENTANYL CITRATE (PF) 100 MCG/2ML IJ SOLN
50.0000 ug | INTRAMUSCULAR | Status: DC | PRN
  Filled 2021-08-21: qty 2

## 2021-08-21 MED ORDER — ONDANSETRON HCL 4 MG/2ML IJ SOLN: 4.0000 mg | INTRAMUSCULAR | Status: DC | PRN

## 2021-08-21 MED ORDER — TERBUTALINE SULFATE 1 MG/ML IJ SOLN: 0.2500 mg | Freq: Once | INTRAMUSCULAR | Status: DC | PRN

## 2021-08-21 MED ORDER — COCONUT OIL OIL: 1.0000 | TOPICAL_OIL | Status: DC | PRN

## 2021-08-21 MED ORDER — IBUPROFEN 600 MG PO TABS
600.0000 mg | ORAL_TABLET | Freq: Four times a day (QID) | ORAL | Status: DC
Start: 2021-08-21 — End: 2021-08-23
  Administered 2021-08-21 – 2021-08-23 (×7): 600 mg via ORAL
  Filled 2021-08-21 (×9): qty 1

## 2021-08-21 MED ORDER — MISOPROSTOL 25 MCG QUARTER TABLET: 25.0000 ug | ORAL_TABLET | ORAL | Status: DC | PRN

## 2021-08-21 MED ORDER — PRENATAL MULTIVITAMIN CH
1.0000 | ORAL_TABLET | Freq: Every day | ORAL | Status: DC
Start: 2021-08-21 — End: 2021-08-23
  Administered 2021-08-21 – 2021-08-23 (×3): 1 via ORAL
  Filled 2021-08-21 (×3): qty 1

## 2021-08-21 MED ORDER — LIDOCAINE HCL (PF) 1 % IJ SOLN
30.0000 mL | INTRAMUSCULAR | Status: AC | PRN
Start: 2021-08-21 — End: 2021-08-21
  Administered 2021-08-21 (×2): 30 mL via SUBCUTANEOUS
  Filled 2021-08-21: qty 30

## 2021-08-21 MED ORDER — FUROSEMIDE 20 MG PO TABS
20.0000 mg | ORAL_TABLET | Freq: Every day | ORAL | Status: DC
  Administered 2021-08-21 – 2021-08-23 (×3): 20 mg via ORAL
  Filled 2021-08-21 (×3): qty 1

## 2021-08-21 MED ORDER — SIMETHICONE 80 MG PO CHEW: 80.0000 mg | CHEWABLE_TABLET | ORAL | Status: DC | PRN

## 2021-08-21 NOTE — Discharge Summary (Shared)
Postpartum Discharge Summary  Date of Service updated***     Patient Name: Danielle Monroe DOB: 10-26-1990 MRN: [id number]  Date of admission: 08/21/2021 Delivery date:08/21/2021  Delivering provider: Patriciaann Clan  Date of discharge: 08/21/2021  Admitting diagnosis: Post-dates pregnancy [O48.0] Intrauterine pregnancy: [redacted]w[redacted]d    Secondary diagnosis:  Principal Problem:   Post-dates pregnancy Active Problems:   Language barrier   Asymptomatic bacteriuria during pregnancy in second trimester  Additional problems: ***    Discharge diagnosis: Term Pregnancy Delivered                                              Post partum procedures:{Postpartum procedures:23558} Augmentation: N/A Complications: None  Hospital course: Onset of Labor With Vaginal Delivery      31y.o. yo G2P1001 at 3109w4das admitted in Active Labor on 08/21/2021. Patient had an uncomplicated labor course as follows:  Membrane Rupture Time/Date: 6:40 AM ,08/21/2021   Delivery Method:Vaginal, Spontaneous  Episiotomy: None  Lacerations:  2nd degree  Patient had an uncomplicated postpartum course.  She is ambulating, tolerating a regular diet, passing flatus, and urinating well. Patient is discharged home in stable condition on 08/21/21.  Newborn Data: Birth date:08/21/2021  Birth time:7:06 AM  Gender:Female  Living status:Living  Apgars:9 ,9  Weight:   Magnesium Sulfate received: No BMZ received: No Rhophylac:No MMR:No T-DaP:Given prenatally Flu: No Transfusion:{Transfusion received:30440034}  Physical exam  Vitals:   08/21/21 0524 08/21/21 0551  BP:  (!) 149/101  Pulse: (!) 110 91  Resp:  18  SpO2: 100% 99%  Weight: 71.7 kg   Height: _0  (1.499 m)    General: {Exam; general:21111117} Lochia: {Desc; appropriate/inappropriate:30686::"appropriate"} Uterine Fundus: {Desc; firm/soft:30687} Incision: {Exam; incision:21111123} DVT Evaluation: {Exam; dvt:2111122} Labs: Lab Results  Component  Value Date   WBC 7.6 08/21/2021   HGB 12.0 08/21/2021   HCT 36.2 08/21/2021   MCV 83.6 08/21/2021   PLT 222 08/21/2021      Latest Ref Rng & Units 05/09/2020    9:03 AM  CMP  Glucose 70 - 99 mg/dL 94   BUN 6 - 23 mg/dL 6   Creatinine 0.40 - 1.20 mg/dL 0.61   Sodium 135 - 145 mEq/L 138   Potassium 3.5 - 5.1 mEq/L 4.3   Chloride 96 - 112 mEq/L 104   CO2 19 - 32 mEq/L 27   Calcium 8.4 - 10.5 mg/dL 9.4   AST 0 - 37 U/L 14   ALT 0 - 35 U/L 17    Edinburgh Score:     No data to display           After visit meds:  Allergies as of 08/21/2021   No Known Allergies   Med Rec must be completed prior to using this [facility]        Discharge home in stable condition Infant Feeding: {Baby feeding:23562} Infant Disposition:{CHL IP OB HOME WITH MORCVELF:81017}ischarge instruction: per After Visit Summary and Postpartum booklet. Activity: Advance as tolerated. Pelvic rest for 6 weeks.  Diet: {OB diPZWC:58527782}uture Appointments: Future Appointments  Date Time Provider [place]/11/2021  1:[facility]7/13/2023  7:00 AM MC-LD SCHED ROOM [facility]IN[facility] None   Follow up Visit:  [facility] Plan Please schedule this patient for a In person postpartum visit  in 6 weeks with the following provider: Any provider. Additional Postpartum F/U:BP check 1 week (elevated in labor, but admitted in active without anesthesia)  Low risk pregnancy complicated by:  See above Delivery mode:  Vaginal, Spontaneous  Anticipated Birth Control:   declines    08/21/2021 Patriciaann Clan, DO

## 2021-08-21 NOTE — Progress Notes (Addendum)
Danielle Monroe is a 31 y.o. G2P2002 at [redacted]w[redacted]d s/p NSVD at 0700.   Subjective: Patient doing well, has no complaints.   Objective: BP 129/90   Pulse 90   Resp 18   Ht 4\' 11"  (1.499 m)   Wt 71.7 kg   LMP 11/08/2020   SpO2 99%   Breastfeeding Unknown   BMI 31.91 kg/m  No intake/output data recorded. Total I/O In: -  Out: 200 [Blood:200]  Labs: Lab Results  Component Value Date   WBC 7.6 08/21/2021   HGB 12.0 08/21/2021   HCT 36.2 08/21/2021   MCV 83.6 08/21/2021   PLT 222 08/21/2021    Patient Vitals for the past 24 hrs:  BP Pulse Resp SpO2 Height Weight 08/21/21 0916 129/90 90 -- -- -- -- 08/21/21 0901 (!) 139/97 91 -- -- -- -- 08/21/21 0845 (!) 142/93 87 -- -- -- -- 08/21/21 0831 (!) 151/99 84 -- -- -- -- 08/21/21 0823 (!) 145/100 91 -- -- -- -- 08/21/21 0551 (!) 149/101 91 18 99 % -- -- 08/21/21 0524 -- (!) 110 -- 100 % 4\' 11"  (1.499 m) 71.7 kg       Assessment / Plan:  -patient denies any symptoms, she denies HA, floating spots, blurry vision -at this point, will draw CMP and watch BP.  -she is safe to transport to PP with close follow-up.    10/22/21, CNM 08/21/2021, 9:27 AM

## 2021-08-21 NOTE — Progress Notes (Addendum)
Upon admission and education FOB and aunt at bedside are answering for patient before communicating with patient or allowing patient to answer. Both patient and family request that aunt at bedside interpret for patient. Aunt is not staying the entire stay and will be in and out so RN educated that FOB would be the one to provide interpreting if staying or otherwise we will use a stratus interpreter for education. Family agreed.   Both FOB and Aunt stepped out and RN used gujarati interpreter [phone] to ask patient if she was comfortable with family interprets for her and she would like for them to. She was educated on family having to actually interpret for her and not just answer for her and she verbalized understanding. Interpreter form signed and in chart.   [place] completed with interpreter while family was out of the room. Royston Cowper, RN

## 2021-08-21 NOTE — Progress Notes (Signed)
Patient ID: JENEL GIERKE, female   DOB: Apr 14, 1990, 31 y.o.   MRN: [id number] CNM notified by RN of patient's consistently elevated BPs now at 4 hours PP.   Patient Vitals for the past 24 hrs:  BP Temp Temp src Pulse Resp SpO2 Height Weight  08/21/21 1035 (!) 135/96 -- -- 94 18 99 % -- --  08/21/21 0940 (!) 144/96 98.5 F (36.9 C) Oral 87 18 99 % -- --  08/21/21 0916 129/90 -- -- 90 -- -- -- --  08/21/21 0901 (!) 139/97 -- -- 91 -- -- -- --  08/21/21 0845 (!) 142/93 -- -- 87 -- -- -- --  08/21/21 0831 (!) 151/99 -- -- 84 -- -- -- --  08/21/21 0823 (!) 145/100 -- -- 91 -- -- -- --  08/21/21 0551 (!) 149/101 -- -- 91 18 99 % -- --  08/21/21 0524 -- -- -- (!) 110 -- 100 % 4\' 11"  (1.499 m) 71.7 kg   CMP was normal; CBC normal at 6 am.   Will start lasix and procardia 30 and continue to monitor.   

## 2021-08-21 NOTE — Progress Notes (Signed)
Luna Kitchens, CNM called about patients blood pressures remaining high to clarify when MD's would like to be notified. Per CNM unless patient becomes severe range, or symptomatic we do not need to call about BP and give procardia time to work. Royston Cowper, RN

## 2021-08-21 NOTE — H&P (Signed)
LABOR AND DELIVERY ADMISSION HISTORY AND PHYSICAL NOTE  Danielle Monroe is a 31 y.o. female G2P1001 with IUP at [redacted]w[redacted]d by 52 week [place] presenting for spontaneous onset of labor. Presented to MAU at 9 cm.   She reports positive fetal movement. She denies leakage of fluid or vaginal bleeding.  Prenatal History/Complications: PNC at [facility] Pregnancy complications:  - Language barrier, but does speak english. Aunt at bedside providing additional interpretation (signed paper to decline interpreter)  - Elevated blood pressure on arrival without previous diagnosis - GBS positive   Past Medical History: Past Medical History:  Diagnosis Date   Breast discharge 05/12/2020   Cervical incompetence 04/08/2021   Had cerclage placed in [place] in 1st pregnancy due to travel plans-->sounds like it was done prophylactically due to travel.  3 CL 3.4 cm at anatomy u/s   DUB (dysfunctional uterine bleeding) 04/2020   Medical history non-contributory    Vitamin B12 deficiency    Vitamin D deficiency     Past Surgical History: Past Surgical History:  Procedure Laterality Date   CERVICAL CERCLAGE  02/2016    Obstetrical History: OB History     Gravida  2   Para  1   Term  1   Preterm  0   AB  0   Living  1      SAB  0   IAB  0   Ectopic  0   Multiple      Live Births  1           Social History: Social History   Socioeconomic History   Marital status: Married    Spouse name: Not on file   Number of children: Not on file   Years of education: Not on file   Highest education level: Bachelor's degree (e.g., BA, AB, BS)  Occupational History   Not on file  Tobacco Use   Smoking status: Never   Smokeless tobacco: Never  Vaping Use   Vaping Use: Never used  Substance and Sexual Activity   Alcohol use: Never   Drug use: Never   Sexual activity: Yes    Birth control/protection: None  Other Topics Concern   Not on file  Social History Narrative   ** Merged History  Encounter **       Social Determinants of Health   Financial Resource Strain: Low Risk  (08/07/2021)   Overall Financial Resource Strain (CARDIA)    Difficulty of Paying Living Expenses: Not hard at all  Food Insecurity: No Food Insecurity (08/14/2021)   Hunger Vital Sign    Worried About Running Out of Food in the Last Year: Never true    Ran Out of Food in the Last Year: Never true  Transportation Needs: No Transportation Needs (08/14/2021)   PRAPARE - Hydrologist (Medical): No    Lack of Transportation (Non-Medical): No  Physical Activity: Insufficiently Active (08/07/2021)   Exercise Vital Sign    Days of Exercise per Week: 3 days    Minutes of Exercise per Session: 20 min  Stress: No Stress Concern Present (08/07/2021)   [place]    Feeling of Stress : Not at all  Social Connections: Moderately Integrated (08/07/2021)   Social Connection and Isolation Panel [NHANES]    Frequency of Communication with Friends and Family: More than three times a week    Frequency of Social Gatherings with Friends and Family: Twice a  week    Attends Religious Services: 1 to 4 times per year    Active Member of Clubs or Organizations: No    Attends Engineer, structural: Not on file    Marital Status: Married    Family History: Family History  Problem Relation Age of Onset   Hypertension Father     Allergies: No Known Allergies  Medications Prior to Admission  Medication Sig Dispense Refill Last Dose   aspirin EC 81 MG tablet Take 81 mg by mouth daily. Swallow whole. (Patient not taking: Reported on 08/20/2021)      ferrous sulfate 325 (65 FE) MG tablet Take 1 tablet (325 mg total) by mouth every other day. 30 tablet 3    guaiFENesin-dextromethorphan (ROBITUSSIN DM) 100-10 MG/5ML syrup Take 5 mLs by mouth every 4 (four) hours as needed for cough. (Patient not taking: Reported on 07/31/2021)  118 mL 0    pantoprazole (PROTONIX) 40 MG tablet Take 1 tablet (40 mg total) by mouth daily. 30 tablet 2    Prenatal Vit-Fe Fumarate-FA (MULTIVITAMIN-PRENATAL) 27-0.8 MG TABS tablet Take 1 tablet by mouth daily at 12 noon. 30 tablet 6      Review of Systems  All systems reviewed and negative except as stated in HPI  Physical Exam Blood pressure (!) 149/101, pulse 91, resp. rate 18, height 4\' 11"  (1.499 m), weight 71.7 kg, last menstrual period 11/08/2020, SpO2 99 %, currently breastfeeding. General appearance: alert, oriented, NAD (grunting with contractions)  Lungs: normal respiratory effort Heart: regular rate Abdomen: soft, non-tender; gravid, FH appropriate for GA Extremities: No calf swelling or tenderness Presentation: cephalic Fetal monitoring: 135/mod/15x15/none Uterine activity: every 3 minutes  Dilation: 10 Effacement (%): 100 Station: -1 Exam by:: Dr. [email or web address]  Prenatal labs: ABO, Rh: --/--/O POS (07/07 09-[birth year]-1982) Antibody: NEG (07/07 0610) Rubella: 2.56 (02/22 1536) RPR: Non Reactive (04/21 0857)  HBsAg: Negative (02/22 1536)  HIV: Non Reactive (04/21 0857)  GC/Chlamydia: Negative  GBS: Positive/-- (06/16 1037)  2-hr GTT: passed Genetic screening: LR NIPS Anatomy 09-01-2000: normal   Prenatal Transfer Tool  Maternal Diabetes: No Genetic Screening: Normal Maternal Ultrasounds/Referrals: Normal Fetal Ultrasounds or other Referrals:  None Maternal Substance Abuse:  No Significant Maternal Medications:  None Significant Maternal Lab Results: Group B Strep positive  Results for orders placed or performed during the hospital encounter of 08/21/21 (from the past 24 hour(s))  CBC   Collection Time: 08/21/21  6:08 AM  Result Value Ref Range   WBC 7.6 4.0 - 10.5 K/uL   RBC 4.33 3.87 - 5.11 MIL/uL   Hemoglobin 12.0 12.0 - 15.0 g/dL   HCT 10/22/21 56.4 - 33.2 %   MCV 83.6 80.0 - 100.0 fL   MCH 27.7 26.0 - 34.0 pg   MCHC 33.1 30.0 - 36.0 g/dL   RDW 95.1 88.4 - 16.6 %   Platelets  222 150 - 400 K/uL   nRBC 0.0 0.0 - 0.2 %  Type and screen   Collection Time: 08/21/21  6:10 AM  Result Value Ref Range   ABO/RH(D) O POS    Antibody Screen NEG    Sample Expiration      08/24/2021,2359 Performed at [facility], [address], [place], [place]     Patient Active Problem List   Diagnosis Date Noted   Post-dates pregnancy 08/21/2021   Asymptomatic bacteriuria during pregnancy in second trimester 04/11/2021   Supervision of high risk pregnancy, antepartum 04/08/2021   Language barrier 04/13/2016  Assessment: Danielle Monroe is a 31 y.o. G2P1001 at [redacted]w[redacted]d here for spontaneous onset of labor.   #Labor: Active labor at 9cm, expect SVD shortly.  #Pain: None, IV pain medication after delivery  #FWB: Cat I  #ID: GBS positive, Amp started-- unlikely to be adequate given advanced labor  #MOF: Breast #MOC: declines, aware of her options and has no questions  #Circ: Yes inpatient   #Elevated blood pressure without diagnosis: BP mild range on arrival. No symptoms, in the setting of active labor. Will obtain labs if persistent.   Allayne Stack 08/21/2021, 8:22 AM

## 2021-08-21 NOTE — Lactation Note (Signed)
This note was copied from a baby's chart. Lactation Consultation Note  Patient Name: Danielle Monroe [id number] Date: 08/21/2021 Reason for consult: Initial assessment;Mother's request;Difficult latch;Term;Breastfeeding assistance;Other (Comment) (Vit D def, B 12 Def, ( Vegan) PIH ( lasix and procardia)) Age:33 hours  Mom aware of Vit B12 and Vit D def, vegan diet. Mom will be in communication with provider on supplementation.  Plan 1. To feed based on cues 8-12x 24hr period. Mom to offer breasts and look for signs of milk transfer.  2. If unable to latch, Mom to hand express or pump and offer colostrum on spoon 5-7 ml per feeding.   All questions  answered at the end of the visit.   Maternal Data Has patient been taught Hand Expression?: Yes Does the patient have breastfeeding experience prior to this delivery?: Yes How long did the patient breastfeed?: 1 1/2 years  Feeding Mother's Current Feeding Choice: Breast Milk  LATCH Score Latch: Repeated attempts needed to sustain latch, nipple held in mouth throughout feeding, stimulation needed to elicit sucking reflex.  Audible Swallowing: None  Type of Nipple: Flat (one short shafted more than the other, will evert with nipple role.)  Comfort (Breast/Nipple): Soft / non-tender  Hold (Positioning): Assistance needed to correctly position infant at breast and maintain latch.  LATCH Score: 5   Lactation Tools Discussed/Used Tools: Pump;Flanges Flange Size: 24 Breast pump type: Manual Pump Education: Setup, frequency, and cleaning;Milk Storage Reason for Pumping: elongate nipple Pumping frequency: pre pump before latching 6 x each breast  Interventions Interventions: Breast feeding basics reviewed;Assisted with latch;Skin to skin;Breast massage;Hand express;Pre-pump if needed;Breast compression;Adjust position;Support pillows;Position options;Expressed milk;Hand pump;Education;Armed forces technical officer  Discharge Pump: Manual WIC Program: Yes  Consult Status Consult Status: Follow-up Date: 08/22/21 Follow-up type: In-patient    Danielle Monroe  Danielle Monroe 08/21/2021, 1:44 PM

## 2021-08-21 NOTE — Lactation Note (Signed)
This note was copied from a baby's chart. Lactation Consultation Note  Patient Name: Danielle Monroe Date: 08/21/2021   Age:33 hours   Mother having repair.  Lactation to follow up later today.    [name] Boschen  RN IBCLC 08/21/2021, 8:22 AM

## 2021-08-21 NOTE — MAU Note (Signed)
.  Danielle Monroe is a 31 y.o. at [redacted]w[redacted]d here in MAU reporting ctxs since 0400. Denies VB or LOF. Reports good FM  Onset of complaint: 0400 Pain score: 10 Vitals:   08/21/21 0524  Pulse: (!) 110  SpO2: 100%     FHT:135 Lab orders placed from triage:  u/a

## 2021-08-22 MED ORDER — NIFEDIPINE ER OSMOTIC RELEASE 30 MG PO TB24
30.0000 mg | ORAL_TABLET | Freq: Two times a day (BID) | ORAL | Status: DC
  Administered 2021-08-22 (×2): 30 mg via ORAL
  Filled 2021-08-22 (×2): qty 1

## 2021-08-22 MED ORDER — PANTOPRAZOLE SODIUM 40 MG PO TBEC
40.0000 mg | DELAYED_RELEASE_TABLET | Freq: Every day | ORAL | Status: DC
  Administered 2021-08-22: 40 mg via ORAL
  Filled 2021-08-22: qty 1

## 2021-08-22 NOTE — Plan of Care (Signed)
  Problem: Education: Goal: Knowledge of General Education information will improve Description: Including pain rating scale, medication(s)/side effects and non-pharmacologic comfort measures Outcome: Progressing   Problem: Health Behavior/Discharge Planning: Goal: Ability to manage health-related needs will improve Outcome: Progressing   Problem: Clinical Measurements: Goal: Ability to maintain clinical measurements within normal limits will improve Outcome: Progressing Goal: Will remain free from infection Outcome: Progressing Goal: Diagnostic test results will improve Outcome: Progressing Goal: Respiratory complications will improve Outcome: Progressing Goal: Cardiovascular complication will be avoided Outcome: Progressing   Problem: Activity: Goal: Risk for activity intolerance will decrease Outcome: Progressing   Problem: Nutrition: Goal: Adequate nutrition will be maintained Outcome: Progressing   Problem: Coping: Goal: Level of anxiety will decrease Outcome: Progressing   Problem: Elimination: Goal: Will not experience complications related to bowel motility Outcome: Progressing Goal: Will not experience complications related to urinary retention Outcome: Progressing   Problem: Pain Managment: Goal: General experience of comfort will improve Outcome: Progressing   Problem: Safety: Goal: Ability to remain free from injury will improve Outcome: Progressing   Problem: Skin Integrity: Goal: Risk for impaired skin integrity will decrease Outcome: Progressing   Problem: Education: Goal: Knowledge of Childbirth will improve Outcome: Progressing Goal: Ability to make informed decisions regarding treatment and plan of care will improve Outcome: Progressing Goal: Ability to state and carry out methods to decrease the pain will improve Outcome: Progressing Goal: Individualized Educational Video(s) Outcome: Progressing   Problem: Coping: Goal: Ability to  verbalize concerns and feelings about labor and delivery will improve Outcome: Progressing   Problem: Life Cycle: Goal: Ability to make normal progression through stages of labor will improve Outcome: Progressing Goal: Ability to effectively push during vaginal delivery will improve Outcome: Progressing   Problem: Role Relationship: Goal: Will demonstrate positive interactions with the child Outcome: Progressing   Problem: Safety: Goal: Risk of complications during labor and delivery will decrease Outcome: Progressing   Problem: Pain Management: Goal: Relief or control of pain from uterine contractions will improve Outcome: Progressing   Problem: Education: Goal: Knowledge of condition will improve Outcome: Progressing Goal: Individualized Educational Video(s) Outcome: Progressing Goal: Individualized Newborn Educational Video(s) Outcome: Progressing   Problem: Activity: Goal: Will verbalize the importance of balancing activity with adequate rest periods Outcome: Progressing Goal: Ability to tolerate increased activity will improve Outcome: Progressing   Problem: Coping: Goal: Ability to identify and utilize available resources and services will improve Outcome: Progressing   Problem: Life Cycle: Goal: Chance of risk for complications during the postpartum period will decrease Outcome: Progressing   Problem: Role Relationship: Goal: Ability to demonstrate positive interaction with newborn will improve Outcome: Progressing   Problem: Skin Integrity: Goal: Demonstration of wound healing without infection will improve Outcome: Progressing   Problem: Education: Goal: Knowledge of condition will improve Outcome: Progressing Goal: Individualized Educational Video(s) Outcome: Progressing Goal: Individualized Newborn Educational Video(s) Outcome: Progressing   Problem: Activity: Goal: Will verbalize the importance of balancing activity with adequate rest  periods Outcome: Progressing Goal: Ability to tolerate increased activity will improve Outcome: Progressing   Problem: Coping: Goal: Ability to identify and utilize available resources and services will improve Outcome: Progressing   Problem: Life Cycle: Goal: Chance of risk for complications during the postpartum period will decrease Outcome: Progressing   Problem: Role Relationship: Goal: Ability to demonstrate positive interaction with newborn will improve Outcome: Progressing   Problem: Skin Integrity: Goal: Demonstration of wound healing without infection will improve Outcome: Progressing   

## 2021-08-22 NOTE — Progress Notes (Signed)
CSW met with MOB at bedside to address consult regarding questions about Medicaid. Per chart review, both infant and MOB are insured by [employer]. CSW introduced self and explained reason for consult. MOB declined interpreter and requested that her guest interpret. MOB's guest asked questions about Medicaid coverage, CSW answered questions and encouraged follow up with insurance company for further details. MOB and guest thanked CSW and denied any additional needs/concerns.   Raunel Dimartino, LCSW Clinical Social Worker [facility] Cell#: [phone]  

## 2021-08-22 NOTE — Lactation Note (Addendum)
This note was copied from a baby's chart. Lactation Consultation Note  Patient Name: Danielle Monroe [id number] Date: 08/22/2021 Reason for consult: Follow-up assessment Age:33 hours  P2, Ex BF.  Called to room to assist with breastfeeding. Parents state baby latches off and on breast. Mother hand expressed good flow.   Baby latched but after a few minutes but did not sustain latch.  Baby did not actively suck.  Set up DEBP and suggest if baby continues to have this type of feeding, post pump and give volume back at next feeding with spoon. If baby sustains latch for more than 10-15 min, mother will not need to pump. Stork pump paperwork sent.  Mother pumped approx 1 ml.  Explained rusty pipe. Mother will spoon feed volume when baby wakes.   Feeding Mother's Current Feeding Choice: Breast Milk  LATCH Score Latch: Repeated attempts needed to sustain latch, nipple held in mouth throughout feeding, stimulation needed to elicit sucking reflex.  Audible Swallowing: A few with stimulation  Type of Nipple: Everted at rest and after stimulation  Comfort (Breast/Nipple): Soft / non-tender  Hold (Positioning): Assistance needed to correctly position infant at breast and maintain latch.  LATCH Score: 7   Lactation Tools Discussed/Used Tools: Pump Flange Size: 24 Breast pump type: Double-Electric Breast Pump;Manual Pump Education: Setup, frequency, and cleaning;Milk Storage Reason for Pumping: stimulation and supplementation Pumping frequency: as needed  Interventions Interventions: Breast feeding basics reviewed;Assisted with latch;Skin to skin;Hand express;DEBP;Hand pump;Education  Discharge Pump:  (Stork pump paperwork sent) WIC Program: Yes  Consult Status Consult Status: Follow-up Date: 08/23/21 Follow-up type: In-patient    [name] [facility] 08/22/2021, 9:43 AM

## 2021-08-22 NOTE — Progress Notes (Signed)
Post Partum Day 1 Subjective: no complaints, up ad lib, voiding, and tolerating PO  Objective: Blood pressure (!) 136/93, pulse 92, temperature 98.3 F (36.8 C), temperature source Oral, resp. rate 17, height 4\' 11"  (1.499 m), weight 71.7 kg, last menstrual period 11/08/2020, SpO2 100 %, unknown if currently breastfeeding. Vitals:   08/21/21 1745 08/21/21 1924 08/21/21 2044 08/22/21 0514  BP: (!) 123/97 (!) 134/92 (!) 139/93 (!) 136/93  Pulse: 88 85 85 92  Resp: 20  18 17   Temp:   98.6 F (37 C) 98.3 F (36.8 C)  TempSrc:   Oral Oral  SpO2:      Weight:      Height:        Physical Exam:  General: alert, cooperative, and appears stated age 31: appropriate Uterine Fundus: firm DVT Evaluation: No evidence of DVT seen on physical exam.  Recent Labs    08/21/21 0608  HGB 12.0  HCT 36.2    Assessment/Plan: Plan for discharge tomorrow, Breastfeeding, Lactation consult, and Contraception declines Continue lasix Continue Procardia, increase to 30 mg BID   LOS: 1 day   Lochia, MD 08/22/2021, 8:51 AM

## 2021-08-23 LAB — CBC
HCT: 34.5 % — ABNORMAL LOW (ref 36.0–46.0)
Hemoglobin: 11.5 g/dL — ABNORMAL LOW (ref 12.0–15.0)
MCH: 27.8 pg (ref 26.0–34.0)
MCHC: 33.3 g/dL (ref 30.0–36.0)
MCV: 83.3 fL (ref 80.0–100.0)
Platelets: 240 10*3/uL (ref 150–400)
RBC: 4.14 MIL/uL (ref 3.87–5.11)
RDW: 14.4 % (ref 11.5–15.5)
WBC: 8.2 10*3/uL (ref 4.0–10.5)
nRBC: 0 % (ref 0.0–0.2)

## 2021-08-23 LAB — COMPREHENSIVE METABOLIC PANEL
ALT: 27 U/L (ref 0–44)
AST: 37 U/L (ref 15–41)
Albumin: 2.7 g/dL — ABNORMAL LOW (ref 3.5–5.0)
Alkaline Phosphatase: 126 U/L (ref 38–126)
Anion gap: 9 (ref 5–15)
BUN: 6 mg/dL (ref 6–20)
CO2: 23 mmol/L (ref 22–32)
Calcium: 8.9 mg/dL (ref 8.9–10.3)
Chloride: 104 mmol/L (ref 98–111)
Creatinine, Ser: 0.58 mg/dL (ref 0.44–1.00)
GFR, Estimated: >60 mL/min (ref >60)
Glucose, Bld: 113 mg/dL — ABNORMAL HIGH (ref 70–99)
Potassium: 3.8 mmol/L (ref 3.5–5.1)
Sodium: 136 mmol/L (ref 135–145)
Total Bilirubin: 0.3 mg/dL (ref 0.3–1.2)
Total Protein: 5.9 g/dL — ABNORMAL LOW (ref 6.5–8.1)

## 2021-08-23 MED ORDER — IBUPROFEN 600 MG PO TABS: 600.0000 mg | ORAL_TABLET | Freq: Four times a day (QID) | ORAL | 0 refills | Status: DC

## 2021-08-23 MED ORDER — NIFEDIPINE ER 60 MG PO TB24
60.0000 mg | ORAL_TABLET | Freq: Two times a day (BID) | ORAL | 2 refills | Status: DC
Start: 2021-08-23 — End: 2021-09-30

## 2021-08-23 MED ORDER — NIFEDIPINE ER OSMOTIC RELEASE 30 MG PO TB24
60.0000 mg | ORAL_TABLET | Freq: Two times a day (BID) | ORAL | Status: DC
  Administered 2021-08-23: 60 mg via ORAL
  Filled 2021-08-23: qty 2

## 2021-08-23 MED ORDER — NIFEDIPINE ER OSMOTIC RELEASE 30 MG PO TB24: 60.0000 mg | ORAL_TABLET | Freq: Every day | ORAL | Status: DC

## 2021-08-23 MED ORDER — NIFEDIPINE ER 60 MG PO TB24: 60.0000 mg | ORAL_TABLET | Freq: Two times a day (BID) | ORAL | 0 refills | Status: DC

## 2021-08-23 MED ORDER — FUROSEMIDE 20 MG PO TABS: 20.0000 mg | ORAL_TABLET | Freq: Every day | ORAL | 0 refills | Status: DC

## 2021-08-23 MED ORDER — ACETAMINOPHEN 500 MG PO TABS: 1000.0000 mg | ORAL_TABLET | Freq: Four times a day (QID) | ORAL | 0 refills | Status: DC | PRN

## 2021-08-23 NOTE — Lactation Note (Signed)
This note was copied from a baby's chart. Lactation Consultation Note  Patient Name: Danielle Monroe [id number] Date: 08/23/2021 Reason for consult: Follow-up assessment Age:33 hours   P2 mother whose infant is now 54 hours old.  This is a term baby at 39+4 weeks.  Mother's current feeding preference is breast/formula.  Baby has a 10% weight loss this morning.  Discharge teaching completed.  Discussed the 10% weight loss with parents.  Stressed the importance of supplementing with 30+ mls after discharge.  Volumes have been low.  Encouraged burping halfway through the feeding and again at the end of the feeding.  Asked mother to always breast feed first and follow with formula.  Suggested feeding every three hours or sooner if baby desires until his weight loss stabilizes.  Family had no further questions/concerns.  They have our OP phone number for any concerns after discharge.  Pediatric follow up appointment is tomorrow.  Baby will be discharged after circumcision and circumcision checks are completed.  Family members present.   Maternal Data    Feeding    LATCH Score                    Lactation Tools Discussed/Used    Interventions Interventions: Education  Discharge Discharge Education: Engorgement and breast care Pump: Personal;Manual  Consult Status Consult Status: Complete Date: 08/23/21 Follow-up type: Call as needed    Kiyah Demartini R Amauri Medellin 08/23/2021, 10:32 AM

## 2021-08-24 ENCOUNTER — Encounter: Payer: Self-pay | Admitting: *Deleted

## 2021-08-24 ENCOUNTER — Telehealth: Payer: Self-pay | Admitting: *Deleted

## 2021-08-24 ENCOUNTER — Other Ambulatory Visit: Payer: Medicaid Other

## 2021-08-24 NOTE — Telephone Encounter (Signed)
Received bp alert from Babyscripts of BP's 08/23/21 = 127/95, 129/94, 128/91. Per chart review delivered 08/21/21 and had elevated BP on admit. Was d/c 08/23/21 on Procardia 60mg  daily. I called [facility] and left a message I am calling re: a BP alert and advise if she is having severe headache, worsening edema she needs to go to hospital for evaluation asap.  Will also send MyChart message.  

## 2021-08-25 ENCOUNTER — Encounter: Payer: Self-pay | Admitting: Obstetrics & Gynecology

## 2021-08-27 ENCOUNTER — Inpatient Hospital Stay (HOSPITAL_COMMUNITY)
Admission: AD | Admit: 2021-08-27 | Payer: Medicaid Other | Source: Home / Self Care | Admitting: Obstetrics and Gynecology

## 2021-08-27 ENCOUNTER — Inpatient Hospital Stay (HOSPITAL_COMMUNITY): Payer: Medicaid Other

## 2021-08-28 ENCOUNTER — Ambulatory Visit (INDEPENDENT_AMBULATORY_CARE_PROVIDER_SITE_OTHER): Payer: Medicaid Other

## 2021-08-28 ENCOUNTER — Other Ambulatory Visit: Payer: Self-pay

## 2021-08-28 VITALS — BP 129/88 | HR 93 | Wt 135.4 lb

## 2021-08-28 DIAGNOSIS — Z0130 Encounter for examination of blood pressure without abnormal findings: Secondary | ICD-10-CM

## 2021-08-28 NOTE — Progress Notes (Signed)
Blood Pressure Check Visit  Danielle Monroe is here for blood pressure check following vaginal delivery on 08/24/21. Elevated BP during admission for delivery without diagnosis of pre-e. Discharged on short course of Lasix and nifedipine 60 mg BID. Patient reports a few remaining Lasix tablets (encouraged completion) and taking nifedipine once a day in afternoon. BP today is 129/88. Babyscripts values included below:    Reviewed with Donavan Foil, MD who recommends patient continue taking nifedipine once a day, but in the AM instead of afternoon. Pt to continue checking BP daily and will return for PP visit.   Marjo Bicker, RN 08/28/2021  10:09 AM

## 2021-08-29 ENCOUNTER — Inpatient Hospital Stay (HOSPITAL_COMMUNITY): Payer: Medicaid Other

## 2021-08-31 ENCOUNTER — Encounter: Payer: Self-pay | Admitting: General Practice

## 2021-09-30 ENCOUNTER — Encounter: Payer: Self-pay | Admitting: Obstetrics and Gynecology

## 2021-09-30 ENCOUNTER — Ambulatory Visit (INDEPENDENT_AMBULATORY_CARE_PROVIDER_SITE_OTHER): Payer: Medicaid Other | Admitting: Obstetrics and Gynecology

## 2021-09-30 ENCOUNTER — Other Ambulatory Visit: Payer: Self-pay

## 2021-09-30 DIAGNOSIS — O165 Unspecified maternal hypertension, complicating the puerperium: Secondary | ICD-10-CM | POA: Insufficient documentation

## 2021-09-30 MED ORDER — NIFEDIPINE ER OSMOTIC RELEASE 30 MG PO TB24: 30.0000 mg | ORAL_TABLET | Freq: Every day | ORAL | 1 refills | Status: DC

## 2021-09-30 NOTE — Progress Notes (Signed)
    Post Partum Visit Note  Danielle Monroe is a 31 y.o. [id number] s/p SVD/2nd degree at 39wks after presenting in labor. PP period c/b peripartum HTN.  Anesthesia:  Local lidocaine, IV fentanyl for repair  . Postpartum course has been uncomplicated. Baby is doing well. Baby is feeding by breast. Bleeding no bleeding. Bowel function is normal. Bladder function is normal. Patient is not sexually active. Contraception method is none- patient does not want contraception at this time. Postpartum depression screening: negative.  she does endorse some low back and pelvic pain. No lower urinary tract s/s  Patient stopped her procardia 15 days ago. She denies any s/s of pre-eclampsia. She states she has a BP cuff at home.   Edinburgh Postnatal Depression Scale - 09/30/21 0920       Edinburgh Postnatal Depression Scale:  In the Past 7 Days   I have been able to laugh and see the funny side of things. 0    I have looked forward with enjoyment to things. 0    I have blamed myself unnecessarily when things went wrong. 0    I have been anxious or worried for no good reason. 0    I have felt scared or panicky for no good reason. 0    Things have been getting on top of me. 0    I have been so unhappy that I have had difficulty sleeping. 0    I have felt sad or miserable. 0    I have been so unhappy that I have been crying. 0    The thought of harming myself has occurred to me. 0    Edinburgh Postnatal Depression Scale Total 0             Review of Systems Pertinent items noted in HPI and remainder of comprehensive ROS otherwise negative.  Objective:  BP (!) 127/96   Pulse 79   Wt 133 lb 4.8 oz (60.5 kg)   LMP 11/08/2020   Breastfeeding Yes   BMI 26.92 kg/m    General: NAD Abdomen: soft, nttp EGBUS normal  Assessment:   Routine PP exam  Plan:  *PP: routine care. D/w her okay to start pelvic and abodminal exercises and discomfort continues after about a month or two to let us  know and do a trial of pelvic floor PT. Pap neg 2022. Pt declines anything for birth control. +vaginal dryness due to breastfeeding. Recommend waiting another 2 weeks before resuming sex and using lubrication. *GHTN: she was on procardia 60 bid. Will do procardia xl 30 qday to start today.   Patient declines interpeter  RTC: 7-10d for virtual RN BP check. BP check in one month (can have pt d/c bp med approx 1wk beforehand to that check)  [place] Bing, MD [facility] [employer], [facility]

## 2021-10-07 ENCOUNTER — Telehealth (INDEPENDENT_AMBULATORY_CARE_PROVIDER_SITE_OTHER): Payer: Medicaid Other

## 2021-10-07 DIAGNOSIS — O0990 Supervision of high risk pregnancy, unspecified, unspecified trimester: Secondary | ICD-10-CM

## 2021-10-07 DIAGNOSIS — O165 Unspecified maternal hypertension, complicating the puerperium: Secondary | ICD-10-CM

## 2021-10-07 MED ORDER — PANTOPRAZOLE SODIUM 40 MG PO TBEC: 40.0000 mg | DELAYED_RELEASE_TABLET | Freq: Every day | ORAL | 2 refills | Status: AC

## 2021-10-07 NOTE — Progress Notes (Signed)
I connected with  Danielle Monroe on 10/07/21 at  9:00 AM EDT by MyChart video and verified that I am speaking with the correct person using two identifiers.   Visit today is for BP check. Pt approx 6w postpartum with diagnosis of postpartum hypertension. Currently taking nifedipine 30 mg daily. BP today is 118/81. Reviewed plan to discontinue nifedipine in 3 weeks with nurse visit to check BP to follow 1 week later. Return precautions reviewed with patient. Patient requests protonix refill; rx ordered.  Marjo Bicker, RN 10/07/2021  9:06 AM

## 2021-10-08 NOTE — Progress Notes (Signed)
Addend: Patient located at home and provider located at [facility].   Fleet Contras RN 10/08/21

## 2021-11-04 ENCOUNTER — Telehealth (INDEPENDENT_AMBULATORY_CARE_PROVIDER_SITE_OTHER): Payer: Medicaid Other | Admitting: General Practice

## 2021-11-04 VITALS — BP 115/88 | HR 66

## 2021-11-04 DIAGNOSIS — Z0130 Encounter for examination of blood pressure without abnormal findings: Secondary | ICD-10-CM

## 2021-11-04 NOTE — Progress Notes (Signed)
Connected with patient virtually for blood pressure check. Per chart review, patient had an appt 8/23 for BP check and was taken off nifedipine. Patient denies headaches, dizziness, or blurry vision. BP 115/88 today at home. Recommended she follow up with PCP as needed. Patient verbalized understanding.   Koren Bound RN BSN 11/04/21

## 2021-12-30 DIAGNOSIS — K219 Gastro-esophageal reflux disease without esophagitis: Secondary | ICD-10-CM | POA: Diagnosis not present

## 2021-12-30 DIAGNOSIS — K5900 Constipation, unspecified: Secondary | ICD-10-CM | POA: Diagnosis not present

## 2022-07-05 ENCOUNTER — Other Ambulatory Visit: Payer: Self-pay | Admitting: Family Medicine

## 2022-07-05 ENCOUNTER — Encounter: Payer: Self-pay | Admitting: Family Medicine

## 2022-07-05 ENCOUNTER — Ambulatory Visit (INDEPENDENT_AMBULATORY_CARE_PROVIDER_SITE_OTHER): Payer: Medicaid Other | Admitting: Family Medicine

## 2022-07-05 ENCOUNTER — Telehealth: Payer: Self-pay | Admitting: Family Medicine

## 2022-07-05 VITALS — BP 116/76 | HR 84 | Temp 98.4°F | Ht 60.0 in | Wt 128.8 lb

## 2022-07-05 DIAGNOSIS — Z0001 Encounter for general adult medical examination with abnormal findings: Secondary | ICD-10-CM

## 2022-07-05 DIAGNOSIS — L853 Xerosis cutis: Secondary | ICD-10-CM

## 2022-07-05 DIAGNOSIS — Z789 Other specified health status: Secondary | ICD-10-CM

## 2022-07-05 DIAGNOSIS — E538 Deficiency of other specified B group vitamins: Secondary | ICD-10-CM | POA: Diagnosis not present

## 2022-07-05 DIAGNOSIS — E559 Vitamin D deficiency, unspecified: Secondary | ICD-10-CM | POA: Diagnosis not present

## 2022-07-05 DIAGNOSIS — Z13220 Encounter for screening for lipoid disorders: Secondary | ICD-10-CM | POA: Diagnosis not present

## 2022-07-05 DIAGNOSIS — E611 Iron deficiency: Secondary | ICD-10-CM

## 2022-07-05 DIAGNOSIS — Z0000 Encounter for general adult medical examination without abnormal findings: Secondary | ICD-10-CM

## 2022-07-05 LAB — URINALYSIS, ROUTINE W REFLEX MICROSCOPIC
Bilirubin Urine: NEGATIVE
Hgb urine dipstick: NEGATIVE
Ketones, ur: NEGATIVE
Nitrite: NEGATIVE
Specific Gravity, Urine: 1.01 (ref 1.000–1.030)
Total Protein, Urine: NEGATIVE
Urine Glucose: NEGATIVE
Urobilinogen, UA: 0.2 (ref 0.0–1.0)
pH: 6 (ref 5.0–8.0)

## 2022-07-05 LAB — COMPREHENSIVE METABOLIC PANEL
ALT: 12 U/L (ref 0–35)
AST: 13 U/L (ref 0–37)
Albumin: 4.4 g/dL (ref 3.5–5.2)
Alkaline Phosphatase: 85 U/L (ref 39–117)
BUN: 8 mg/dL (ref 6–23)
CO2: 26 mEq/L (ref 19–32)
Calcium: 9.5 mg/dL (ref 8.4–10.5)
Chloride: 102 mEq/L (ref 96–112)
Creatinine, Ser: 0.57 mg/dL (ref 0.40–1.20)
GFR: 118.9 mL/min (ref >60.00)
Glucose, Bld: 84 mg/dL (ref 70–99)
Potassium: 4 mEq/L (ref 3.5–5.1)
Sodium: 138 mEq/L (ref 135–145)
Total Bilirubin: 0.5 mg/dL (ref 0.2–1.2)
Total Protein: 7.3 g/dL (ref 6.0–8.3)

## 2022-07-05 LAB — CBC
HCT: 36.9 % (ref 36.0–46.0)
Hemoglobin: 12.5 g/dL (ref 12.0–15.0)
MCHC: 33.7 g/dL (ref 30.0–36.0)
MCV: 83.4 fl (ref 78.0–100.0)
Platelets: 297 10*3/uL (ref 150.0–400.0)
RBC: 4.43 Mil/uL (ref 3.87–5.11)
RDW: 13.2 % (ref 11.5–15.5)
WBC: 6.2 10*3/uL (ref 4.0–10.5)

## 2022-07-05 LAB — LIPID PANEL
Cholesterol: 210 mg/dL — ABNORMAL HIGH (ref 0–200)
HDL: 67.3 mg/dL (ref >39.00)
LDL Cholesterol: 127 mg/dL — ABNORMAL HIGH (ref 0–99)
NonHDL: 143.05
Total CHOL/HDL Ratio: 3
Triglycerides: 80 mg/dL (ref 0.0–149.0)
VLDL: 16 mg/dL (ref 0.0–40.0)

## 2022-07-05 MED ORDER — DOXEPIN HCL 5 % EX CREA: TOPICAL_CREAM | CUTANEOUS | 1 refills | Status: DC

## 2022-07-05 MED ORDER — PREDNISONE 10 MG PO TABS: 10.0000 mg | ORAL_TABLET | Freq: Two times a day (BID) | ORAL | 0 refills | Status: AC

## 2022-07-05 NOTE — Telephone Encounter (Signed)
Pt husband called stating that medication requires authorization per pharmacy.  Doxepin HCl 5 % CREA

## 2022-07-05 NOTE — Progress Notes (Signed)
Established Patient Office Visit   Subjective:  Patient ID: Danielle Monroe, female    DOB: 12/25/1990  Age: 32 y.o. MRN: [id number]  Chief Complaint  Patient presents with   Annual Exam    CPE,concerns about recent itchiness all over body x 2 weeks. Patient fasting.     HPI Encounter Diagnoses  Name Primary?   Healthcare maintenance Yes   Vegetarian diet    Xerosis cutis    Here for health check and follow-up of the above.  Has a 41-month-old at home and continues to breast-feed.  She consumes a vegetarian diet.  There is dairy allowed in her diet.  Generalized pruritus in her arms chest and neck.  No specific rash.  No history of atopy or asthma.  She is traveling to [place] for family reunion next week.  Does not currently have access to dental care.  He is not exercising regularly.  She is up-to-date on well woman care through her GYN provider.   Review of Systems  Constitutional: Negative.   HENT: Negative.    Eyes:  Negative for blurred vision, discharge and redness.  Respiratory: Negative.    Cardiovascular: Negative.   Gastrointestinal:  Negative for abdominal pain.  Genitourinary: Negative.   Musculoskeletal: Negative.  Negative for myalgias.  Skin:  Positive for itching. Negative for rash.  Neurological:  Negative for tingling, loss of consciousness and weakness.  Endo/Heme/Allergies:  Negative for polydipsia.     Current Outpatient Medications:    Doxepin HCl 5 % CREA, Apply a thin coat daily to pruritic areas of skin as needed., Disp: 45 g, Rfl: 1   pantoprazole (PROTONIX) 40 MG tablet, Take 1 tablet (40 mg total) by mouth daily., Disp: 30 tablet, Rfl: 2   predniSONE (DELTASONE) 10 MG tablet, Take 1 tablet (10 mg total) by mouth 2 (two) times daily with a meal for 5 days., Disp: 10 tablet, Rfl: 0   Prenatal Vit-Fe Fumarate-FA (MULTIVITAMIN-PRENATAL) 27-0.8 MG TABS tablet, Take 1 tablet by mouth daily at 12 noon., Disp: 30 tablet, Rfl: 6   Objective:      BP 116/76 (BP Location: Left Arm, Patient Position: Sitting, Cuff Size: Normal)   Pulse 84   Temp 98.4 F (36.9 C) (Temporal)   Ht 5' (1.524 m)   Wt 128 lb 12.8 oz (58.4 kg)   LMP  (LMP Unknown)   SpO2 96%   Breastfeeding No   BMI 25.15 kg/m    Physical Exam Constitutional:      General: She is not in acute distress.    Appearance: Normal appearance. She is not ill-appearing, toxic-appearing or diaphoretic.  HENT:     Head: Normocephalic and atraumatic.     Right Ear: Tympanic membrane, ear canal and external ear normal.     Left Ear: Tympanic membrane, ear canal and external ear normal.     Mouth/Throat:     Mouth: Mucous membranes are moist.     Pharynx: Oropharynx is clear. No oropharyngeal exudate or posterior oropharyngeal erythema.  Eyes:     General: No scleral icterus.       Right eye: No discharge.        Left eye: No discharge.     Extraocular Movements: Extraocular movements intact.     Conjunctiva/sclera: Conjunctivae normal.     Pupils: Pupils are equal, round, and reactive to light.  Cardiovascular:     Rate and Rhythm: Normal rate and regular rhythm.  Pulmonary:     Effort: Pulmonary  effort is normal. No respiratory distress.     Breath sounds: Normal breath sounds.  Abdominal:     General: Bowel sounds are normal.  Musculoskeletal:     Cervical back: No rigidity or tenderness.  Skin:    General: Skin is warm and dry.     Findings: No rash.  Neurological:     Mental Status: She is alert and oriented to person, place, and time.  Psychiatric:        Mood and Affect: Mood normal.        Behavior: Behavior normal.      No results found for any visits on 07/05/22.    The ASCVD Risk score (Arnett DK, et al., 2019) failed to calculate for the following reasons:   The 2019 ASCVD risk score is only valid for ages 3 to 37    Assessment & Plan:   Healthcare maintenance -     CBC -     Comprehensive metabolic panel -     Lipid panel -      Urinalysis, Routine w reflex microscopic  Vegetarian diet -     VITAMIN D 25 Hydroxy (Vit-D Deficiency, Fractures) -     Iron, TIBC and Ferritin Panel -     Vitamin B12 -     Zinc  Xerosis cutis -     predniSONE; Take 1 tablet (10 mg total) by mouth 2 (two) times daily with a meal for 5 days.  Dispense: 10 tablet; Refill: 0 -     Doxepin HCl; Apply a thin coat daily to pruritic areas of skin as needed.  Dispense: 45 g; Refill: 1    Return in about 6 months (around 01/05/2023), or if symptoms worsen or fail to improve.  Suspect that some of her pruritus could be secondary to stress.  Short burst of prednisone.  Doxepin cream as needed.  Advised her to use gentle soap such as Dove or May.  Advised her to apply moisturizers directly after showering.  Checking labs associated with deficiencies of vegetarian diets.  Routine health labs are checked today 2.  Information was given on health maintenance and disease prevention.  Encouraged her to exercise by walking for 30 minutes 5 days weekly.  Pushing the baby in the stroller would qualify.  Will obtain dental care as soon as she is able.  Mliss Sax, MD

## 2022-07-06 LAB — VITAMIN D 25 HYDROXY (VIT D DEFICIENCY, FRACTURES): VITD: 21.61 ng/mL — ABNORMAL LOW (ref 30.00–100.00)

## 2022-07-06 LAB — VITAMIN B12: Vitamin B-12: 272 pg/mL (ref 211–911)

## 2022-07-06 MED ORDER — VITAMIN B-12 1000 MCG PO TABS: 1000.0000 ug | ORAL_TABLET | Freq: Every day | ORAL | 2 refills | Status: AC

## 2022-07-06 MED ORDER — IRON (FERROUS SULFATE) 325 (65 FE) MG PO TABS: 325.0000 mg | ORAL_TABLET | ORAL | 2 refills | Status: DC

## 2022-07-06 MED ORDER — VITAMIN D3 125 MCG (5000 UT) PO CAPS: 5000.0000 [IU] | ORAL_CAPSULE | Freq: Every day | ORAL | 1 refills | Status: AC

## 2022-07-06 NOTE — Addendum Note (Signed)
Addended by: Andrez Grime on: 07/06/2022 05:24 PM   Modules accepted: Orders

## 2022-07-07 ENCOUNTER — Other Ambulatory Visit: Payer: Self-pay | Admitting: Family Medicine

## 2022-07-07 DIAGNOSIS — L853 Xerosis cutis: Secondary | ICD-10-CM

## 2022-07-07 NOTE — Telephone Encounter (Signed)
Patient aware PA processing will inform patient with responses. Also let patient know that prescription was $758 out of pocket cost.

## 2022-07-08 ENCOUNTER — Other Ambulatory Visit: Payer: Self-pay | Admitting: Family Medicine

## 2022-07-08 DIAGNOSIS — L853 Xerosis cutis: Secondary | ICD-10-CM

## 2022-07-09 MED ORDER — TRIAMCINOLONE ACETONIDE 0.05 % EX OINT: TOPICAL_OINTMENT | CUTANEOUS | 2 refills | Status: DC

## 2022-07-09 NOTE — Addendum Note (Signed)
Addended by: Andrez Grime on: 07/09/2022 01:11 PM   Modules accepted: Orders

## 2022-07-09 NOTE — Telephone Encounter (Signed)
Prescription that was sen tin for patient was denied insurance will not cover and out of pocket cost is $758 patient calling to see if there is something else that could be sent in? Please advise

## 2022-07-09 NOTE — Telephone Encounter (Signed)
Patient aware new Rx sent in  

## 2022-07-10 ENCOUNTER — Other Ambulatory Visit: Payer: Self-pay | Admitting: Family Medicine

## 2022-07-10 DIAGNOSIS — L853 Xerosis cutis: Secondary | ICD-10-CM

## 2022-07-14 LAB — IRON,TIBC AND FERRITIN PANEL
%SAT: 16 % (calc) (ref 16–45)
Ferritin: 15 ng/mL — ABNORMAL LOW (ref 16–154)
Iron: 65 ug/dL (ref 40–190)
TIBC: 416 mcg/dL (calc) (ref 250–450)

## 2022-07-14 LAB — EXTRA LAV TOP TUBE

## 2022-07-14 LAB — ZINC: Zinc: 70 ug/dL (ref 60–130)

## 2022-09-17 ENCOUNTER — Encounter: Payer: Self-pay | Admitting: Family Medicine

## 2022-09-17 ENCOUNTER — Ambulatory Visit (INDEPENDENT_AMBULATORY_CARE_PROVIDER_SITE_OTHER): Payer: Medicaid Other | Admitting: Family Medicine

## 2022-09-17 VITALS — BP 112/86 | HR 92 | Temp 98.2°F | Ht 60.0 in | Wt 125.2 lb

## 2022-09-17 DIAGNOSIS — J22 Unspecified acute lower respiratory infection: Secondary | ICD-10-CM

## 2022-09-17 DIAGNOSIS — R6889 Other general symptoms and signs: Secondary | ICD-10-CM | POA: Diagnosis not present

## 2022-09-17 DIAGNOSIS — E611 Iron deficiency: Secondary | ICD-10-CM

## 2022-09-17 DIAGNOSIS — E559 Vitamin D deficiency, unspecified: Secondary | ICD-10-CM | POA: Diagnosis not present

## 2022-09-17 LAB — POC COVID19 BINAXNOW: SARS Coronavirus 2 Ag: NEGATIVE

## 2022-09-17 MED ORDER — AZITHROMYCIN 250 MG PO TABS: ORAL_TABLET | ORAL | 0 refills | Status: AC

## 2022-09-17 NOTE — Progress Notes (Signed)
Established Patient Office Visit   Subjective:  Patient ID: Danielle Monroe, female    DOB: 31-Aug-1990  Age: 32 y.o. MRN: [id number]  Chief Complaint  Patient presents with   Cough    Persistanr cough, fever, chills and body aches x 6 days.     Cough Associated symptoms include ear pain. Pertinent negatives include no chills, eye redness, fever, hemoptysis, myalgias, rash, shortness of breath or wheezing.   Encounter Diagnoses  Name Primary?   Flu-like symptoms Yes   Lower respiratory infection    Vitamin D deficiency    Iron deficiency    Presents with a 6 to 7-day history of URI's signs and symptoms.  There were initially body aches and fevers and chills that have since resolved.  She has been left with postnasal drip and a productive cough of green phlegm.  There is no wheezing or history of asthma.  Denies facial pressure or teeth pain.  Ears have been uncomfortable.  She has been taking her iron supplement, vitamin D supplement and B12 supplement   Review of Systems  Constitutional: Negative.  Negative for chills and fever.  HENT:  Positive for congestion and ear pain. Negative for sinus pain.   Eyes:  Negative for blurred vision, discharge and redness.  Respiratory:  Positive for cough and sputum production. Negative for hemoptysis, shortness of breath and wheezing.   Cardiovascular: Negative.   Gastrointestinal:  Negative for abdominal pain.  Genitourinary: Negative.   Musculoskeletal: Negative.  Negative for joint pain and myalgias.  Skin:  Negative for rash.  Neurological:  Negative for tingling, loss of consciousness and weakness.  Endo/Heme/Allergies:  Negative for polydipsia.     Current Outpatient Medications:    azithromycin (ZITHROMAX) 250 MG tablet, Take 2 tablets on day 1, then 1 tablet daily on days 2 through 5, Disp: 6 tablet, Rfl: 0   Cholecalciferol (VITAMIN D3) 125 MCG (5000 UT) CAPS, Take 1 capsule (5,000 Units total) by mouth daily. (Patient not  taking: Reported on 09/17/2022), Disp: 90 capsule, Rfl: 1   cyanocobalamin (VITAMIN B12) 1000 MCG tablet, Take 1 tablet (1,000 mcg total) by mouth daily. (Patient not taking: Reported on 09/17/2022), Disp: 90 tablet, Rfl: 2   Doxepin HCl 5 % CREA, APPLY A THIN COAT DAILY TO PRURITIC AREAS OF SKIN AS NEEDED. (Patient not taking: Reported on 09/17/2022), Disp: 45 g, Rfl: 1   Iron, Ferrous Sulfate, 325 (65 Fe) MG TABS, Take 325 mg by mouth every other day. (Patient not taking: Reported on 09/17/2022), Disp: 45 tablet, Rfl: 2   pantoprazole (PROTONIX) 40 MG tablet, Take 1 tablet (40 mg total) by mouth daily. (Patient not taking: Reported on 09/17/2022), Disp: 30 tablet, Rfl: 2   Prenatal Vit-Fe Fumarate-FA (MULTIVITAMIN-PRENATAL) 27-0.8 MG TABS tablet, Take 1 tablet by mouth daily at 12 noon. (Patient not taking: Reported on 09/17/2022), Disp: 30 tablet, Rfl: 6   TRIAMCINOLONE ACETONIDE, TOP, 0.05 % OINT, Apply a thin coat to itchy areas daily as needed. (Patient not taking: Reported on 09/17/2022), Disp: 110 g, Rfl: 2   Objective:     BP 112/86   Pulse 92   Temp 98.2 F (36.8 C)   Ht 5' (1.524 m)   Wt 125 lb 3.2 oz (56.8 kg)   SpO2 98%   BMI 24.45 kg/m    Physical Exam Constitutional:      General: She is not in acute distress.    Appearance: Normal appearance. She is not ill-appearing, toxic-appearing or diaphoretic.  HENT:     Head: Normocephalic and atraumatic.     Right Ear: Tympanic membrane, ear canal and external ear normal.     Left Ear: Tympanic membrane, ear canal and external ear normal.  Eyes:     General: No scleral icterus.       Right eye: No discharge.        Left eye: No discharge.     Extraocular Movements: Extraocular movements intact.     Conjunctiva/sclera: Conjunctivae normal.     Pupils: Pupils are equal, round, and reactive to light.  Cardiovascular:     Rate and Rhythm: Normal rate and regular rhythm.  Pulmonary:     Effort: Pulmonary effort is normal. No respiratory  distress.     Breath sounds: Normal breath sounds. No wheezing, rhonchi or rales.  Musculoskeletal:     Cervical back: No rigidity or tenderness.  Lymphadenopathy:     Cervical: No cervical adenopathy.  Skin:    General: Skin is warm and dry.  Neurological:     Mental Status: She is alert and oriented to person, place, and time.  Psychiatric:        Mood and Affect: Mood normal.        Behavior: Behavior normal.      Results for orders placed or performed in visit on 09/17/22  POC COVID-19 BinaxNow  Result Value Ref Range   SARS Coronavirus 2 Ag Negative Negative      The ASCVD Risk score (Arnett DK, et al., 2019) failed to calculate for the following reasons:   The 2019 ASCVD risk score is only valid for ages 60 to 65    Assessment & Plan:   Flu-like symptoms -     POC COVID-19 BinaxNow  Lower respiratory infection -     Azithromycin; Take 2 tablets on day 1, then 1 tablet daily on days 2 through 5  Dispense: 6 tablet; Refill: 0  Vitamin D deficiency  Iron deficiency    Return if symptoms worsen or fail to improve.  Will start Zithromax.  Follow-up in 1 week if not improving.  Continue supplementation as indicated above.  Mliss Sax, MD

## 2022-10-12 ENCOUNTER — Other Ambulatory Visit (HOSPITAL_COMMUNITY): Payer: Self-pay

## 2022-10-12 ENCOUNTER — Telehealth: Payer: Self-pay

## 2022-10-12 DIAGNOSIS — L853 Xerosis cutis: Secondary | ICD-10-CM

## 2022-10-12 NOTE — Telephone Encounter (Signed)
Pharmacy Patient Advocate Encounter   Received notification from CoverMyMeds that prior authorization for Doxepin HCl 5% cream is required/requested.   Insurance verification completed.   The patient is insured through [facility].   Per test claim: PA required; PA submitted to [facility] via CoverMyMeds Key/confirmation #/EOC [id number] Status is pending

## 2022-10-14 MED ORDER — TRIAMCINOLONE ACETONIDE 0.1 % EX OINT: TOPICAL_OINTMENT | CUTANEOUS | 1 refills | Status: AC

## 2022-10-14 NOTE — Telephone Encounter (Signed)
Pharmacy Patient Advocate Encounter  Received notification from [facility] that Prior Authorization for Doxepin HCl 5% cream has been DENIED. Please advise how you'd like to proceed. Full denial letter will be uploaded to the media tab. See denial reason below.   PA #/Case ID/Reference #: [id number]

## 2022-10-14 NOTE — Addendum Note (Signed)
Addended by: Nadene Rubins A on: 10/14/2022 10:00 AM   Modules accepted: Orders

## 2023-01-05 ENCOUNTER — Ambulatory Visit: Payer: Medicaid Other | Admitting: Family Medicine

## 2023-01-06 ENCOUNTER — Ambulatory Visit (INDEPENDENT_AMBULATORY_CARE_PROVIDER_SITE_OTHER): Payer: Medicaid Other | Admitting: Family Medicine

## 2023-01-06 VITALS — BP 106/80 | HR 81 | Temp 98.0°F | Ht 60.0 in | Wt 125.8 lb

## 2023-01-06 DIAGNOSIS — E538 Deficiency of other specified B group vitamins: Secondary | ICD-10-CM

## 2023-01-06 DIAGNOSIS — E611 Iron deficiency: Secondary | ICD-10-CM

## 2023-01-06 DIAGNOSIS — Z91018 Allergy to other foods: Secondary | ICD-10-CM

## 2023-01-06 DIAGNOSIS — E559 Vitamin D deficiency, unspecified: Secondary | ICD-10-CM | POA: Diagnosis not present

## 2023-01-06 LAB — VITAMIN B12: Vitamin B-12: 248 pg/mL (ref 211–911)

## 2023-01-06 LAB — VITAMIN D 25 HYDROXY (VIT D DEFICIENCY, FRACTURES): VITD: 16.71 ng/mL — ABNORMAL LOW (ref 30.00–100.00)

## 2023-01-06 NOTE — Progress Notes (Signed)
Established Patient Office Visit   Subjective:  Patient ID: Danielle Monroe, female    DOB: 17-Apr-1990  Age: 32 y.o. MRN: [id number]  Chief Complaint  Patient presents with   Medical Management of Chronic Issues    6 month follow up. Pt is fasting. Pt will be goig to [place] for 2 months    HPI Encounter Diagnoses  Name Primary?   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  Reports compliance with with given supplements.  Told CMA otherwise.  She develops a burning in her mouth and throat when she consumes hazelnuts.  She has no other issues with tree nuts.  Tree nuts are important part of her diet is a vegan vegetarian.  Their young son is 14 months.  Family back in [place] will be seeing him for the first time !   Review of Systems  Constitutional: Negative.   HENT: Negative.    Eyes:  Negative for blurred vision, discharge and redness.  Respiratory: Negative.    Cardiovascular: Negative.   Gastrointestinal:  Negative for abdominal pain.  Genitourinary: Negative.   Musculoskeletal: Negative.  Negative for myalgias.  Skin:  Negative for rash.  Neurological:  Negative for tingling, loss of consciousness and weakness.  Endo/Heme/Allergies:  Negative for polydipsia.     Current Outpatient Medications:    Multiple Vitamin (MULTIVITAMIN) tablet, Take 1 tablet by mouth daily., Disp: , Rfl:    Cholecalciferol (VITAMIN D3) 125 MCG (5000 UT) CAPS, Take 1 capsule (5,000 Units total) by mouth daily. (Patient not taking: Reported on 09/17/2022), Disp: 90 capsule, Rfl: 1   cyanocobalamin (VITAMIN B12) 1000 MCG tablet, Take 1 tablet (1,000 mcg total) by mouth daily. (Patient not taking: Reported on 09/17/2022), Disp: 90 tablet, Rfl: 2   Doxepin HCl 5 % CREA, APPLY A THIN COAT DAILY TO PRURITIC AREAS OF SKIN AS NEEDED. (Patient not taking: Reported on 09/17/2022), Disp: 45 g, Rfl: 1   Iron, Ferrous Sulfate, 325 (65 Fe) MG  TABS, Take 325 mg by mouth every other day. (Patient not taking: Reported on 09/17/2022), Disp: 45 tablet, Rfl: 2   pantoprazole (PROTONIX) 40 MG tablet, Take 1 tablet (40 mg total) by mouth daily. (Patient not taking: Reported on 09/17/2022), Disp: 30 tablet, Rfl: 2   Prenatal Vit-Fe Fumarate-FA (MULTIVITAMIN-PRENATAL) 27-0.8 MG TABS tablet, Take 1 tablet by mouth daily at 12 noon. (Patient not taking: Reported on 09/17/2022), Disp: 30 tablet, Rfl: 6   triamcinolone ointment (KENALOG) 0.1 %, Apply a thin coat to the itchy areas of skin daily as needed.  Not for the face or private areas. (Patient not taking: Reported on 01/06/2023), Disp: 80 g, Rfl: 1   Objective:     BP 106/80   Pulse 81   Temp 98 F (36.7 C)   Ht 5' (1.524 m)   Wt 125 lb 12.8 oz (57.1 kg)   SpO2 98%   BMI 24.57 kg/m    Physical Exam Constitutional:      General: She is not in acute distress.    Appearance: Normal appearance. She is not ill-appearing, toxic-appearing or diaphoretic.  HENT:     Head: Normocephalic and atraumatic.     Right Ear: External ear normal.     Left Ear: External ear normal.  Eyes:     General: No scleral icterus.       Right eye: No discharge.  Left eye: No discharge.     Extraocular Movements: Extraocular movements intact.     Conjunctiva/sclera: Conjunctivae normal.  Pulmonary:     Effort: Pulmonary effort is normal. No respiratory distress.  Skin:    General: Skin is warm and dry.  Neurological:     Mental Status: She is alert and oriented to person, place, and time.  Psychiatric:        Mood and Affect: Mood normal.        Behavior: Behavior normal.      No results found for any visits on 01/06/23.    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  May need allergy referral.  They are traveling to [place] tomorrow and will not be back for couple months.  They will be able to see MyChart.  Information was given Motrin on allergies, vegan diet and vegetarian eating information.  Mliss Sax, MD

## 2023-01-07 LAB — ALLERGY PANEL 18, NUT MIX GROUP
Almonds: <0.1 kU/L
CLASS: 0
CLASS: 0
CLASS: 0
CLASS: 0
CLASS: 0
CLASS: 0
Cashew IgE: <0.1 kU/L
Class: 0
Coconut: <0.1 kU/L
Hazelnut: <0.1 kU/L
Peanut IgE: <0.1 kU/L
Pecan Nut: <0.1 kU/L
Sesame Seed f10: <0.1 kU/L

## 2023-01-07 LAB — IRON,TIBC AND FERRITIN PANEL
%SAT: 22 % (ref 16–45)
Ferritin: 16 ng/mL (ref 16–154)
Iron: 77 ug/dL (ref 40–190)
TIBC: 346 ug/dL (ref 250–450)

## 2023-01-07 LAB — INTERPRETATION:

## 2023-07-07 ENCOUNTER — Ambulatory Visit (INDEPENDENT_AMBULATORY_CARE_PROVIDER_SITE_OTHER): Admitting: Family Medicine

## 2023-07-07 ENCOUNTER — Encounter: Payer: Self-pay | Admitting: Family Medicine

## 2023-07-07 VITALS — BP 110/60 | HR 76 | Temp 97.6°F | Ht 60.0 in | Wt 124.0 lb

## 2023-07-07 DIAGNOSIS — Z13220 Encounter for screening for lipoid disorders: Secondary | ICD-10-CM

## 2023-07-07 DIAGNOSIS — E611 Iron deficiency: Secondary | ICD-10-CM | POA: Diagnosis not present

## 2023-07-07 DIAGNOSIS — E559 Vitamin D deficiency, unspecified: Secondary | ICD-10-CM

## 2023-07-07 DIAGNOSIS — Z0000 Encounter for general adult medical examination without abnormal findings: Secondary | ICD-10-CM | POA: Diagnosis not present

## 2023-07-07 DIAGNOSIS — Z131 Encounter for screening for diabetes mellitus: Secondary | ICD-10-CM | POA: Diagnosis not present

## 2023-07-07 DIAGNOSIS — Z789 Other specified health status: Secondary | ICD-10-CM | POA: Diagnosis not present

## 2023-07-07 DIAGNOSIS — E538 Deficiency of other specified B group vitamins: Secondary | ICD-10-CM

## 2023-07-07 NOTE — Progress Notes (Signed)
 Established Patient Office Visit   Subjective:  Patient ID: Danielle Monroe, female    DOB: [birth year]  Age: 33 y.o. MRN: [id number]  Chief Complaint  Patient presents with   Annual Exam    CPE: having concern about white discharge everyday, light smell; no pain with urination, no GYN at this time; still breastfeeding    HPI Encounter Diagnoses  Name Primary?   Healthcare maintenance Yes   Vegetarian diet    B12 deficiency    Vitamin D  deficiency    Screening for diabetes mellitus    Iron  deficiency    Screening for cholesterol level    Here for physical exam.  Doing well.  Continues vegetarian diet.  Lives at home with her husband, 19 and 80-year-old children.  Exercises by walking.  She has been regular dental care.  No recent Pap smear.  She has been taking vitamin D  intermittently.  Continues with iron  and B12 supplements.   Review of Systems  Constitutional: Negative.   HENT: Negative.    Eyes:  Negative for blurred vision, discharge and redness.  Respiratory: Negative.    Cardiovascular: Negative.   Gastrointestinal:  Negative for abdominal pain.  Genitourinary: Negative.   Musculoskeletal: Negative.  Negative for myalgias.  Skin:  Negative for rash.  Neurological:  Negative for tingling, loss of consciousness and weakness.  Endo/Heme/Allergies:  Negative for polydipsia.     Current Outpatient Medications:    Cholecalciferol (VITAMIN D3) 125 MCG (5000 UT) CAPS, Take 1 capsule (5,000 Units total) by mouth daily., Disp: 90 capsule, Rfl: 1   cyanocobalamin  (VITAMIN B12) 1000 MCG tablet, Take 1 tablet (1,000 mcg total) by mouth daily., Disp: 90 tablet, Rfl: 2   Prenatal Vit-Fe Fumarate-FA (MULTIVITAMIN-PRENATAL) 27-0.8 MG TABS tablet, Take 1 tablet by mouth daily at 12 noon., Disp: 30 tablet, Rfl: 6   Doxepin  HCl 5 % CREA, APPLY A THIN COAT DAILY TO PRURITIC AREAS OF SKIN AS NEEDED. (Patient not taking: Reported on 09/17/2022), Disp: 45 g, Rfl: 1   Iron , Ferrous  Sulfate, 325 (65 Fe) MG TABS, Take 325 mg by mouth every other day. (Patient not taking: Reported on 09/17/2022), Disp: 45 tablet, Rfl: 2   Multiple Vitamin (MULTIVITAMIN) tablet, Take 1 tablet by mouth daily., Disp: , Rfl:    pantoprazole  (PROTONIX ) 40 MG tablet, Take 1 tablet (40 mg total) by mouth daily. (Patient not taking: Reported on 09/17/2022), Disp: 30 tablet, Rfl: 2   triamcinolone  ointment (KENALOG ) 0.1 %, Apply a thin coat to the itchy areas of skin daily as needed.  Not for the face or private areas. (Patient not taking: Reported on 01/06/2023), Disp: 80 g, Rfl: 1   Objective:     BP 110/60   Pulse 76   Temp 97.6 F (36.4 C) (Temporal)   Ht 5' (1.524 m)   Wt 124 lb (56.2 kg)   LMP 06/19/2023 (Exact Date)   SpO2 99%   BMI 24.22 kg/m  Wt Readings from Last 3 Encounters:  07/07/23 124 lb (56.2 kg)  01/06/23 125 lb 12.8 oz (57.1 kg)  09/17/22 125 lb 3.2 oz (56.8 kg)      Physical Exam Constitutional:      General: She is not in acute distress.    Appearance: Normal appearance. She is not ill-appearing, toxic-appearing or diaphoretic.  HENT:     Head: Normocephalic and atraumatic.     Right Ear: Tympanic membrane, ear canal and external ear normal.     Left Ear: Tympanic  membrane, ear canal and external ear normal.     Mouth/Throat:     Mouth: Mucous membranes are moist.     Pharynx: Oropharynx is clear. No oropharyngeal exudate or posterior oropharyngeal erythema.  Eyes:     General: No scleral icterus.       Right eye: No discharge.        Left eye: No discharge.     Extraocular Movements: Extraocular movements intact.     Conjunctiva/sclera: Conjunctivae normal.     Pupils: Pupils are equal, round, and reactive to light.  Cardiovascular:     Rate and Rhythm: Normal rate and regular rhythm.  Pulmonary:     Effort: Pulmonary effort is normal. No respiratory distress.     Breath sounds: Normal breath sounds. No wheezing or rales.  Abdominal:     General: Bowel  sounds are normal.     Tenderness: There is no right CVA tenderness or left CVA tenderness.  Musculoskeletal:     Cervical back: No rigidity or tenderness.  Skin:    General: Skin is warm and dry.  Neurological:     Mental Status: She is alert and oriented to person, place, and time.  Psychiatric:        Mood and Affect: Mood normal.        Behavior: Behavior normal.      No results found for any visits on 07/07/23.    The ASCVD Risk score (Arnett DK, et al., 2019) failed to calculate for the following reasons:   The 2019 ASCVD risk score is only valid for ages 56 to 33    Assessment & Plan:   Healthcare maintenance -     Urinalysis, Routine w reflex microscopic  Vegetarian diet  B12 deficiency -     Vitamin B12 -     CBC  Vitamin D  deficiency -     VITAMIN D  25 Hydroxy (Vit-D Deficiency, Fractures)  Screening for diabetes mellitus -     Comprehensive metabolic panel with GFR -     Hemoglobin A1c  Iron  deficiency -     CBC -     Iron , TIBC and Ferritin Panel  Screening for cholesterol level -     Lipid panel    Return in about 1 year (around 07/06/2024), or [facility]..  Recommendations made pending results of labs.  Information was given on health maintenance and disease prevention.   Tonna Frederic, MD

## 2023-07-08 ENCOUNTER — Ambulatory Visit: Payer: Self-pay | Admitting: Family Medicine

## 2023-07-08 LAB — URINALYSIS, ROUTINE W REFLEX MICROSCOPIC
Bilirubin Urine: NEGATIVE
Hgb urine dipstick: NEGATIVE
Ketones, ur: NEGATIVE
Nitrite: NEGATIVE
Specific Gravity, Urine: <=1.005 — AB (ref 1.000–1.030)
Total Protein, Urine: NEGATIVE
Urine Glucose: NEGATIVE
Urobilinogen, UA: 0.2 (ref 0.0–1.0)
pH: 6.5 (ref 5.0–8.0)

## 2023-07-08 LAB — CBC
HCT: 34.5 % — ABNORMAL LOW (ref 36.0–46.0)
Hemoglobin: 11.6 g/dL — ABNORMAL LOW (ref 12.0–15.0)
MCHC: 33.7 g/dL (ref 30.0–36.0)
MCV: 84.3 fl (ref 78.0–100.0)
Platelets: 258 10*3/uL (ref 150.0–400.0)
RBC: 4.09 Mil/uL (ref 3.87–5.11)
RDW: 13.3 % (ref 11.5–15.5)
WBC: 6.9 10*3/uL (ref 4.0–10.5)

## 2023-07-08 LAB — COMPREHENSIVE METABOLIC PANEL WITH GFR
ALT: 10 U/L (ref 0–35)
AST: 13 U/L (ref 0–37)
Albumin: 4.4 g/dL (ref 3.5–5.2)
Alkaline Phosphatase: 44 U/L (ref 39–117)
BUN: 10 mg/dL (ref 6–23)
CO2: 25 meq/L (ref 19–32)
Calcium: 9 mg/dL (ref 8.4–10.5)
Chloride: 103 meq/L (ref 96–112)
Creatinine, Ser: 0.56 mg/dL (ref 0.40–1.20)
GFR: 118.57 mL/min (ref >60.00)
Glucose, Bld: 76 mg/dL (ref 70–99)
Potassium: 3.7 meq/L (ref 3.5–5.1)
Sodium: 135 meq/L (ref 135–145)
Total Bilirubin: 0.5 mg/dL (ref 0.2–1.2)
Total Protein: 7.4 g/dL (ref 6.0–8.3)

## 2023-07-08 LAB — IRON,TIBC AND FERRITIN PANEL
%SAT: 20 % (ref 16–45)
Ferritin: 17 ng/mL (ref 16–154)
Iron: 78 ug/dL (ref 40–190)
TIBC: 386 ug/dL (ref 250–450)

## 2023-07-08 LAB — LIPID PANEL
Cholesterol: 200 mg/dL (ref 0–200)
HDL: 68 mg/dL (ref >39.00)
LDL Cholesterol: 117 mg/dL — ABNORMAL HIGH (ref 0–99)
NonHDL: 132.05
Total CHOL/HDL Ratio: 3
Triglycerides: 76 mg/dL (ref 0.0–149.0)
VLDL: 15.2 mg/dL (ref 0.0–40.0)

## 2023-07-08 LAB — VITAMIN B12: Vitamin B-12: 354 pg/mL (ref 211–911)

## 2023-07-08 LAB — HEMOGLOBIN A1C: Hgb A1c MFr Bld: 5.8 % (ref 4.6–6.5)

## 2023-07-08 LAB — VITAMIN D 25 HYDROXY (VIT D DEFICIENCY, FRACTURES): VITD: 27.56 ng/mL — ABNORMAL LOW (ref 30.00–100.00)

## 2023-08-16 ENCOUNTER — Ambulatory Visit: Payer: Self-pay

## 2023-08-16 ENCOUNTER — Encounter: Payer: Self-pay | Admitting: Family Medicine

## 2023-08-16 ENCOUNTER — Ambulatory Visit (INDEPENDENT_AMBULATORY_CARE_PROVIDER_SITE_OTHER): Admitting: Family Medicine

## 2023-08-16 VITALS — BP 110/60 | HR 87 | Temp 97.8°F | Ht 60.0 in | Wt 124.2 lb

## 2023-08-16 DIAGNOSIS — E559 Vitamin D deficiency, unspecified: Secondary | ICD-10-CM | POA: Diagnosis not present

## 2023-08-16 DIAGNOSIS — M5412 Radiculopathy, cervical region: Secondary | ICD-10-CM | POA: Diagnosis not present

## 2023-08-16 DIAGNOSIS — E611 Iron deficiency: Secondary | ICD-10-CM

## 2023-08-16 DIAGNOSIS — Z789 Other specified health status: Secondary | ICD-10-CM

## 2023-08-16 MED ORDER — PREDNISONE 10 MG (21) PO TBPK: ORAL_TABLET | ORAL | 0 refills | Status: AC

## 2023-08-16 NOTE — Telephone Encounter (Signed)
  FYI Only or Action Required?: FYI only for provider.  Patient was last seen in primary care on 07/07/2023 by Berneta Elsie Sayre, MD. Called Nurse Triage reporting Neurologic Problem. Symptoms began a week ago. Interventions attempted: Nothing. Symptoms are: unchanged.  Triage Disposition: See HCP Within 4 Hours (Or PCP Triage) apt today  Patient/caregiver understands and will follow disposition?: Yes    Copied from CRM [id number]. Topic: Clinical - Red Word Triage >> Aug 16, 2023  1:33 PM Robinson H wrote: [facility] that prompted transfer to Nurse Triage: Pain from neck to right arm, numbness and tingle Reason for Disposition  Neck pain (and neurologic deficit)  Answer Assessment - Initial Assessment Questions 1. SYMPTOM: What is the main symptom you are concerned about? (e.g., weakness, numbness)     Pain from neck to right arm, numbness and tingling 2. ONSET: When did this start? (minutes, hours, days; while sleeping)     A week ago 3. LAST NORMAL: When was the last time you (the patient) were normal (no symptoms)?     A week ago 4. PATTERN Does this come and go, or has it been constant since it started?  Is it present now?     Comes and goes 5. CARDIAC SYMPTOMS: Have you had any of the following symptoms: chest pain, difficulty breathing, palpitations?     no 6. NEUROLOGIC SYMPTOMS: Have you had any of the following symptoms: headache, dizziness, vision loss, double vision, changes in speech, unsteady on your feet?     no 7. OTHER SYMPTOMS: Do you have any other symptoms?     no 8. PREGNANCY: Is there any chance you are pregnant? When was your last menstrual period?     na  Protocols used: Neurologic Deficit-A-AH

## 2023-08-16 NOTE — Progress Notes (Signed)
 Established Patient Office Visit   Subjective:  Patient ID: Danielle Monroe, female    DOB: [birth year]  Age: 33 y.o. MRN: [id number]  Chief Complaint  Patient presents with   neck pain    Right side from neck down to fingers tingling at night started out both now just right side    HPI Encounter Diagnoses  Name Primary?   Vegetarian diet Yes   Vitamin D  deficiency    Iron  deficiency    Cervical radiculopathy    1 month history of intermittent neck discomfort that previously was associated with numbness and tingling in both of her arms.  More recently it is localized to the right arm.  There is now intermittent pain moving into the right arm from the back of the neck with numbness and tingling involving the entire arm in a stocking glove distribution.  RHD.  No injury.  She has had a history of sciatica in the past.  She continues to breast-feed her 41-year-old but is planning on weaning him next month.  Continues a vegetarian diet and has been taking her supplements intermittently.   Review of Systems  Constitutional: Negative.   HENT: Negative.    Eyes:  Negative for blurred vision, discharge and redness.  Respiratory: Negative.    Cardiovascular: Negative.   Gastrointestinal:  Negative for abdominal pain.  Genitourinary: Negative.   Musculoskeletal:  Positive for neck pain. Negative for myalgias.  Skin:  Negative for rash.  Neurological:  Positive for tingling. Negative for loss of consciousness and weakness.  Endo/Heme/Allergies:  Negative for polydipsia.     Current Outpatient Medications:    Cholecalciferol (VITAMIN D3) 125 MCG (5000 UT) CAPS, Take 1 capsule (5,000 Units total) by mouth daily., Disp: 90 capsule, Rfl: 1   cyanocobalamin  (VITAMIN B12) 1000 MCG tablet, Take 1 tablet (1,000 mcg total) by mouth daily., Disp: 90 tablet, Rfl: 2   predniSONE  (STERAPRED UNI-PAK 21 TAB) 10 MG (21) TBPK tablet, Take 6 today, 5 tomorrow, 4 the next day and then 3, 2, 1 and stop,  Disp: 21 tablet, Rfl: 0   Prenatal Vit-Fe Fumarate-FA (MULTIVITAMIN-PRENATAL) 27-0.8 MG TABS tablet, Take 1 tablet by mouth daily at 12 noon., Disp: 30 tablet, Rfl: 6   Doxepin  HCl 5 % CREA, APPLY A THIN COAT DAILY TO PRURITIC AREAS OF SKIN AS NEEDED. (Patient not taking: Reported on 09/17/2022), Disp: 45 g, Rfl: 1   Iron , Ferrous Sulfate , 325 (65 Fe) MG TABS, Take 325 mg by mouth every other day. (Patient not taking: Reported on 08/16/2023), Disp: 45 tablet, Rfl: 2   Multiple Vitamin (MULTIVITAMIN) tablet, Take 1 tablet by mouth daily. (Patient not taking: Reported on 08/16/2023), Disp: , Rfl:    pantoprazole  (PROTONIX ) 40 MG tablet, Take 1 tablet (40 mg total) by mouth daily. (Patient not taking: Reported on 09/17/2022), Disp: 30 tablet, Rfl: 2   triamcinolone  ointment (KENALOG ) 0.1 %, Apply a thin coat to the itchy areas of skin daily as needed.  Not for the face or private areas. (Patient not taking: Reported on 01/06/2023), Disp: 80 g, Rfl: 1   Objective:     BP 110/60 (BP Location: Right Arm, Patient Position: Sitting)   Pulse 87   Temp 97.8 F (36.6 C) (Temporal)   Ht 5' (1.524 m)   Wt 124 lb 3.2 oz (56.3 kg)   LMP 08/08/2023   SpO2 98%   BMI 24.26 kg/m    Physical Exam Constitutional:      General: She is  not in acute distress.    Appearance: Normal appearance. She is not ill-appearing, toxic-appearing or diaphoretic.  HENT:     Head: Normocephalic and atraumatic.     Right Ear: External ear normal.     Left Ear: External ear normal.   Eyes:     General: No scleral icterus.       Right eye: No discharge.        Left eye: No discharge.     Extraocular Movements: Extraocular movements intact.     Conjunctiva/sclera: Conjunctivae normal.   Pulmonary:     Effort: Pulmonary effort is normal. No respiratory distress.   Musculoskeletal:     Right shoulder: Normal. Normal range of motion. Normal strength.     Left shoulder: Normal. Normal range of motion. Normal strength.      Right elbow: Normal.     Left elbow: Normal.     Cervical back: No bony tenderness. No pain with movement. Normal range of motion.       Back:   Skin:    General: Skin is warm and dry.   Neurological:     Mental Status: She is alert and oriented to person, place, and time.     Motor: No weakness.     Deep Tendon Reflexes:     Reflex Scores:      Tricep reflexes are 1+ on the right side and 1+ on the left side.      Bicep reflexes are 1+ on the right side and 1+ on the left side.      Brachioradialis reflexes are 1+ on the right side and 1+ on the left side.  Psychiatric:        Mood and Affect: Mood normal.        Behavior: Behavior normal.      No results found for any visits on 08/16/23.    The ASCVD Risk score (Arnett DK, et al., 2019) failed to calculate for the following reasons:   The 2019 ASCVD risk score is only valid for ages 2 to 72    Assessment & Plan:   Vegetarian diet  Vitamin D  deficiency  Iron  deficiency  Cervical radiculopathy -     Ambulatory referral to Sports Medicine -     predniSONE ; Take 6 today, 5 tomorrow, 4 the next day and then 3, 2, 1 and stop  Dispense: 21 tablet; Refill: 0    Return if symptoms worsen or fail to improve.  6-day Dosepak.  Sports medicine referral.  Encouraged her to take her supplements daily.  Elsie Sim Lent, MD

## 2023-08-17 ENCOUNTER — Ambulatory Visit (INDEPENDENT_AMBULATORY_CARE_PROVIDER_SITE_OTHER): Admitting: Sports Medicine

## 2023-08-17 ENCOUNTER — Encounter: Payer: Self-pay | Admitting: Sports Medicine

## 2023-08-17 VITALS — BP 120/82 | Ht 60.0 in | Wt 124.0 lb

## 2023-08-17 DIAGNOSIS — M5010 Cervical disc disorder with radiculopathy, unspecified cervical region: Secondary | ICD-10-CM

## 2023-08-17 NOTE — Progress Notes (Signed)
   Subjective:    Patient ID: Danielle Monroe, female    DOB: 1990-11-09, 33 y.o.   MRN: [id number]  HPI chief complaint: Right-sided neck and arm pain  Patient is a very pleasant right-hand-dominant 33 year old female that presents today with 1 week of right-sided neck pain, arm pain, and arm numbness.  No known trauma.  She has had similar episodes in the past which have resolved spontaneously but this is the longest that she has had symptoms.  She has not noticed any weakness.  Pain is not affected by shoulder motion.  She saw her primary care physician yesterday and was prescribed prednisone  but has yet to begin that.  She has not tried any over-the-counter pain medication.  Past medical history reviewed Medications reviewed Allergies reviewed   Review of Systems As above    Objective:   Physical Exam  Well-developed, well-nourished.  No acute distress  Cervical spine: Full painless cervical range of motion.  No tenderness along the midline.  No paraspinal musculature spasm.  Positive Spurling's to the right.  Shoulder: Full painless shoulder range of motion  Neurological exam: Strength is 5/5 in both upper extremities.  Reflexes are brisk and equal at the biceps, triceps, and brachioradialis tendons bilaterally.  Sensation is intact to light touch grossly.  No atrophy.      Assessment & Plan:   Cervical radiculopathy  Patient will start her prednisone  as prescribed by her PCP.  We will order physical therapy at [facility] and she may wean to home exercise program per the therapist discretion.  Of note, she also describes a history of past sciatica in the right leg so I will have the physical therapist instruct her in a home exercise program for that as well.  If symptoms persist or worsen consider imaging at that time.  Otherwise, follow-up as needed.  This note was dictated using Dragon naturally speaking software and may contain errors in syntax, spelling, or content which  have not been identified prior to signing this note.

## 2023-08-19 ENCOUNTER — Encounter: Payer: Self-pay | Admitting: Family Medicine

## 2023-08-19 DIAGNOSIS — E611 Iron deficiency: Secondary | ICD-10-CM

## 2023-08-29 MED ORDER — IRON (FERROUS SULFATE) 325 (65 FE) MG PO TABS: 325.0000 mg | ORAL_TABLET | ORAL | 2 refills | Status: AC

## 2024-07-12 ENCOUNTER — Encounter: Admitting: Family Medicine
